# Patient Record
Sex: Female | Born: 1970
Health system: Southern US, Community
[De-identification: ages and names within clinical notes are randomized; demographics above are authoritative.]

## PROBLEM LIST (undated history)

## (undated) DIAGNOSIS — Z973 Presence of spectacles and contact lenses: Secondary | ICD-10-CM

## (undated) DIAGNOSIS — Z87828 Personal history of other (healed) physical injury and trauma: Secondary | ICD-10-CM

## (undated) HISTORY — PX: LUNG SURGERY: SHX703

## (undated) HISTORY — PX: TONSILLECTOMY: SUR1361

## (undated) HISTORY — PX: ENDOMETRIAL ABLATION: SHX621

## (undated) HISTORY — PX: CHOLECYSTECTOMY: SHX55

## (undated) HISTORY — PX: BACK SURGERY: SHX140

## (undated) HISTORY — PX: KNEE ARTHROSCOPY: SHX127

## (undated) HISTORY — PX: EYE SURGERY: SHX253

## (undated) HISTORY — PX: WISDOM TOOTH EXTRACTION: SHX21

## (undated) HISTORY — PX: KNEE ARTHROSCOPY: SUR90

---

## 2002-03-24 ENCOUNTER — Other Ambulatory Visit: Admission: RE | Admit: 2002-03-24 | Discharge: 2002-03-24 | Payer: Self-pay | Admitting: Family Medicine

## 2002-07-28 DIAGNOSIS — I2699 Other pulmonary embolism without acute cor pulmonale: Secondary | ICD-10-CM

## 2002-07-28 DIAGNOSIS — I82409 Acute embolism and thrombosis of unspecified deep veins of unspecified lower extremity: Secondary | ICD-10-CM

## 2002-07-28 HISTORY — DX: Acute embolism and thrombosis of unspecified deep veins of unspecified lower extremity: I82.409

## 2002-07-28 HISTORY — DX: Other pulmonary embolism without acute cor pulmonale: I26.99

## 2002-07-29 ENCOUNTER — Other Ambulatory Visit: Admission: RE | Admit: 2002-07-29 | Discharge: 2002-07-29 | Payer: Self-pay | Admitting: Family Medicine

## 2002-11-28 ENCOUNTER — Other Ambulatory Visit: Admission: RE | Admit: 2002-11-28 | Discharge: 2002-11-28 | Payer: Self-pay | Admitting: Family Medicine

## 2003-05-09 ENCOUNTER — Ambulatory Visit (HOSPITAL_BASED_OUTPATIENT_CLINIC_OR_DEPARTMENT_OTHER): Admission: RE | Admit: 2003-05-09 | Discharge: 2003-05-09 | Payer: Self-pay | Admitting: Orthopaedic Surgery

## 2003-05-15 ENCOUNTER — Encounter: Payer: Self-pay | Admitting: Emergency Medicine

## 2003-05-15 ENCOUNTER — Inpatient Hospital Stay (HOSPITAL_COMMUNITY): Admission: EM | Admit: 2003-05-15 | Discharge: 2003-05-22 | Payer: Self-pay | Admitting: Emergency Medicine

## 2003-11-03 ENCOUNTER — Encounter: Admission: RE | Admit: 2003-11-03 | Discharge: 2003-11-03 | Payer: Self-pay | Admitting: Oncology

## 2004-04-02 ENCOUNTER — Ambulatory Visit (HOSPITAL_BASED_OUTPATIENT_CLINIC_OR_DEPARTMENT_OTHER): Admission: RE | Admit: 2004-04-02 | Discharge: 2004-04-02 | Payer: Self-pay | Admitting: Orthopaedic Surgery

## 2004-04-02 ENCOUNTER — Ambulatory Visit (HOSPITAL_COMMUNITY): Admission: RE | Admit: 2004-04-02 | Discharge: 2004-04-02 | Payer: Self-pay | Admitting: Orthopaedic Surgery

## 2004-06-05 ENCOUNTER — Ambulatory Visit: Payer: Self-pay | Admitting: Oncology

## 2004-06-05 ENCOUNTER — Ambulatory Visit: Admission: RE | Admit: 2004-06-05 | Discharge: 2004-06-05 | Payer: Self-pay | Admitting: Oncology

## 2004-06-27 ENCOUNTER — Other Ambulatory Visit: Admission: RE | Admit: 2004-06-27 | Discharge: 2004-06-27 | Payer: Self-pay | Admitting: Family Medicine

## 2005-05-17 ENCOUNTER — Emergency Department (HOSPITAL_COMMUNITY): Admission: EM | Admit: 2005-05-17 | Discharge: 2005-05-17 | Payer: Self-pay | Admitting: Emergency Medicine

## 2005-09-02 ENCOUNTER — Other Ambulatory Visit: Admission: RE | Admit: 2005-09-02 | Discharge: 2005-09-02 | Payer: Self-pay | Admitting: Family Medicine

## 2006-02-05 ENCOUNTER — Other Ambulatory Visit: Admission: RE | Admit: 2006-02-05 | Discharge: 2006-02-05 | Payer: Self-pay | Admitting: Gynecology

## 2006-02-12 ENCOUNTER — Ambulatory Visit (HOSPITAL_COMMUNITY): Admission: RE | Admit: 2006-02-12 | Discharge: 2006-02-12 | Payer: Self-pay | Admitting: Gynecology

## 2006-02-13 ENCOUNTER — Emergency Department (HOSPITAL_COMMUNITY): Admission: EM | Admit: 2006-02-13 | Discharge: 2006-02-13 | Payer: Self-pay | Admitting: Emergency Medicine

## 2006-08-16 ENCOUNTER — Inpatient Hospital Stay (HOSPITAL_COMMUNITY): Admission: AD | Admit: 2006-08-16 | Discharge: 2006-08-19 | Payer: Self-pay | Admitting: Gynecology

## 2006-08-17 ENCOUNTER — Encounter (INDEPENDENT_AMBULATORY_CARE_PROVIDER_SITE_OTHER): Payer: Self-pay | Admitting: Specialist

## 2006-09-15 ENCOUNTER — Other Ambulatory Visit: Admission: RE | Admit: 2006-09-15 | Discharge: 2006-09-15 | Payer: Self-pay | Admitting: Gynecology

## 2007-02-13 ENCOUNTER — Ambulatory Visit: Payer: Self-pay | Admitting: Critical Care Medicine

## 2007-02-13 ENCOUNTER — Inpatient Hospital Stay (HOSPITAL_COMMUNITY): Admission: EM | Admit: 2007-02-13 | Discharge: 2007-03-09 | Payer: Self-pay | Admitting: Emergency Medicine

## 2007-02-18 ENCOUNTER — Ambulatory Visit: Payer: Self-pay | Admitting: Infectious Disease

## 2007-02-19 ENCOUNTER — Ambulatory Visit: Payer: Self-pay | Admitting: Physical Medicine & Rehabilitation

## 2007-02-19 ENCOUNTER — Ambulatory Visit: Payer: Self-pay | Admitting: Internal Medicine

## 2007-02-24 ENCOUNTER — Encounter: Payer: Self-pay | Admitting: Thoracic Surgery

## 2007-02-24 ENCOUNTER — Ambulatory Visit: Payer: Self-pay | Admitting: Thoracic Surgery

## 2007-03-01 ENCOUNTER — Encounter: Payer: Self-pay | Admitting: Thoracic Surgery

## 2007-03-09 ENCOUNTER — Ambulatory Visit: Payer: Self-pay | Admitting: Physical Medicine & Rehabilitation

## 2007-03-09 ENCOUNTER — Inpatient Hospital Stay (HOSPITAL_COMMUNITY)
Admission: RE | Admit: 2007-03-09 | Discharge: 2007-04-12 | Payer: Self-pay | Admitting: Physical Medicine & Rehabilitation

## 2007-04-15 ENCOUNTER — Encounter: Payer: Self-pay | Admitting: Infectious Disease

## 2007-04-20 ENCOUNTER — Encounter: Payer: Self-pay | Admitting: Infectious Disease

## 2007-04-20 DIAGNOSIS — G061 Intraspinal abscess and granuloma: Secondary | ICD-10-CM | POA: Insufficient documentation

## 2007-05-12 ENCOUNTER — Encounter: Payer: Self-pay | Admitting: Infectious Disease

## 2007-05-21 ENCOUNTER — Encounter
Admission: RE | Admit: 2007-05-21 | Discharge: 2007-08-19 | Payer: Self-pay | Admitting: Physical Medicine & Rehabilitation

## 2007-05-21 ENCOUNTER — Ambulatory Visit: Payer: Self-pay | Admitting: Physical Medicine & Rehabilitation

## 2007-05-21 ENCOUNTER — Encounter: Payer: Self-pay | Admitting: Infectious Disease

## 2007-05-25 ENCOUNTER — Ambulatory Visit (HOSPITAL_COMMUNITY): Admission: RE | Admit: 2007-05-25 | Discharge: 2007-05-25 | Payer: Self-pay | Admitting: Infectious Disease

## 2007-06-04 ENCOUNTER — Ambulatory Visit: Payer: Self-pay | Admitting: Infectious Disease

## 2007-06-04 DIAGNOSIS — A4901 Methicillin susceptible Staphylococcus aureus infection, unspecified site: Secondary | ICD-10-CM | POA: Insufficient documentation

## 2007-06-04 DIAGNOSIS — I82409 Acute embolism and thrombosis of unspecified deep veins of unspecified lower extremity: Secondary | ICD-10-CM | POA: Insufficient documentation

## 2007-06-04 DIAGNOSIS — H6690 Otitis media, unspecified, unspecified ear: Secondary | ICD-10-CM | POA: Insufficient documentation

## 2007-06-04 DIAGNOSIS — N390 Urinary tract infection, site not specified: Secondary | ICD-10-CM | POA: Insufficient documentation

## 2007-06-04 DIAGNOSIS — D6859 Other primary thrombophilia: Secondary | ICD-10-CM | POA: Insufficient documentation

## 2007-06-04 LAB — CONVERTED CEMR LAB
ALT: 18 units/L (ref 0–35)
AST: 18 units/L (ref 0–37)
Alkaline Phosphatase: 81 units/L (ref 39–117)
Basophils Absolute: 0 10*3/uL (ref 0.0–0.1)
CO2: 26 meq/L (ref 19–32)
CRP: 1.3 mg/dL — ABNORMAL HIGH (ref <0.6)
Creatinine, Ser: 0.64 mg/dL (ref 0.40–1.20)
Eosinophils Absolute: 0.2 10*3/uL (ref 0.0–0.7)
Eosinophils Relative: 2 % (ref 0–5)
HCT: 44.9 % (ref 36.0–46.0)
Lymphs Abs: 3.6 10*3/uL — ABNORMAL HIGH (ref 0.7–3.3)
MCV: 76.8 fL — ABNORMAL LOW (ref 78.0–100.0)
Neutrophils Relative %: 51 % (ref 43–77)
Platelets: 300 10*3/uL (ref 150–400)
RDW: 17.8 % — ABNORMAL HIGH (ref 11.5–14.0)
Sed Rate: 7 mm/hr (ref 0–22)
Sodium: 138 meq/L (ref 135–145)
Total Bilirubin: 0.3 mg/dL (ref 0.3–1.2)
Total Protein: 7.1 g/dL (ref 6.0–8.3)
WBC: 9 10*3/uL (ref 4.0–10.5)

## 2007-06-08 ENCOUNTER — Encounter: Payer: Self-pay | Admitting: Infectious Disease

## 2007-07-20 ENCOUNTER — Ambulatory Visit: Payer: Self-pay | Admitting: Physical Medicine & Rehabilitation

## 2007-08-02 ENCOUNTER — Telehealth: Payer: Self-pay | Admitting: Infectious Disease

## 2007-08-05 ENCOUNTER — Ambulatory Visit (HOSPITAL_COMMUNITY): Admission: RE | Admit: 2007-08-05 | Discharge: 2007-08-05 | Payer: Self-pay | Admitting: Infectious Disease

## 2007-08-09 ENCOUNTER — Ambulatory Visit: Payer: Self-pay | Admitting: Infectious Disease

## 2007-08-09 DIAGNOSIS — J13 Pneumonia due to Streptococcus pneumoniae: Secondary | ICD-10-CM | POA: Insufficient documentation

## 2007-08-09 DIAGNOSIS — J869 Pyothorax without fistula: Secondary | ICD-10-CM | POA: Insufficient documentation

## 2007-08-09 LAB — CONVERTED CEMR LAB
Albumin: 3.3 g/dL — ABNORMAL LOW (ref 3.5–5.2)
BUN: 10 mg/dL (ref 6–23)
Basophils Absolute: 0 10*3/uL (ref 0.0–0.1)
CO2: 28 meq/L (ref 19–32)
CRP: 4 mg/dL — ABNORMAL HIGH (ref <0.6)
Eosinophils Absolute: 0.3 10*3/uL (ref 0.0–0.7)
Eosinophils Relative: 3 % (ref 0–5)
Glucose, Bld: 89 mg/dL (ref 70–99)
HCT: 40.8 % (ref 36.0–46.0)
Hemoglobin: 13.7 g/dL (ref 12.0–15.0)
Lymphocytes Relative: 46 % (ref 12–46)
MCHC: 33.6 g/dL (ref 30.0–36.0)
MCV: 78.8 fL (ref 78.0–100.0)
Monocytes Absolute: 0.7 10*3/uL (ref 0.1–1.0)
Platelets: 333 10*3/uL (ref 150–400)
RDW: 19.1 % — ABNORMAL HIGH (ref 11.5–15.5)
Sed Rate: 25 mm/hr — ABNORMAL HIGH (ref 0–22)
Sodium: 139 meq/L (ref 135–145)
Total Bilirubin: 0.2 mg/dL — ABNORMAL LOW (ref 0.3–1.2)
Total Protein: 6.2 g/dL (ref 6.0–8.3)

## 2007-08-12 ENCOUNTER — Telehealth: Payer: Self-pay | Admitting: Infectious Disease

## 2007-08-17 ENCOUNTER — Encounter: Admission: RE | Admit: 2007-08-17 | Discharge: 2007-08-24 | Payer: Self-pay | Admitting: Specialist

## 2007-08-25 ENCOUNTER — Encounter: Admission: RE | Admit: 2007-08-25 | Discharge: 2007-11-23 | Payer: Self-pay | Admitting: Specialist

## 2007-10-15 ENCOUNTER — Encounter (INDEPENDENT_AMBULATORY_CARE_PROVIDER_SITE_OTHER): Payer: Self-pay | Admitting: Family Medicine

## 2007-10-15 ENCOUNTER — Ambulatory Visit (HOSPITAL_COMMUNITY): Admission: RE | Admit: 2007-10-15 | Discharge: 2007-10-15 | Payer: Self-pay | Admitting: Family Medicine

## 2007-10-15 ENCOUNTER — Ambulatory Visit: Payer: Self-pay | Admitting: *Deleted

## 2007-10-16 ENCOUNTER — Encounter
Admission: RE | Admit: 2007-10-16 | Discharge: 2007-12-24 | Payer: Self-pay | Admitting: Physical Medicine & Rehabilitation

## 2007-11-03 ENCOUNTER — Encounter
Admission: RE | Admit: 2007-11-03 | Discharge: 2008-02-01 | Payer: Self-pay | Admitting: Physical Medicine & Rehabilitation

## 2007-12-01 ENCOUNTER — Ambulatory Visit: Payer: Self-pay | Admitting: Physical Medicine & Rehabilitation

## 2008-01-06 ENCOUNTER — Encounter: Payer: Self-pay | Admitting: Infectious Disease

## 2008-01-11 ENCOUNTER — Ambulatory Visit: Payer: Self-pay | Admitting: Infectious Disease

## 2008-01-11 DIAGNOSIS — R61 Generalized hyperhidrosis: Secondary | ICD-10-CM | POA: Insufficient documentation

## 2008-01-11 DIAGNOSIS — Z87448 Personal history of other diseases of urinary system: Secondary | ICD-10-CM | POA: Insufficient documentation

## 2008-01-11 LAB — CONVERTED CEMR LAB
Bilirubin Urine: NEGATIVE
CO2: 24 meq/L (ref 19–32)
Chloride: 104 meq/L (ref 96–112)
HCT: 42.6 % (ref 36.0–46.0)
Lymphocytes Relative: 46 % (ref 12–46)
Lymphs Abs: 4.7 10*3/uL — ABNORMAL HIGH (ref 0.7–4.0)
Monocytes Relative: 7 % (ref 3–12)
Neutrophils Relative %: 42 % — ABNORMAL LOW (ref 43–77)
Platelets: 263 10*3/uL (ref 150–400)
RBC: 5.37 M/uL — ABNORMAL HIGH (ref 3.87–5.11)
Sed Rate: 5 mm/hr (ref 0–22)
Sodium: 139 meq/L (ref 135–145)
Specific Gravity, Urine: 1.023 (ref 1.005–1.03)
Urine Glucose: NEGATIVE mg/dL
WBC: 10.2 10*3/uL (ref 4.0–10.5)
pH: 6 (ref 5.0–8.0)

## 2008-01-12 ENCOUNTER — Ambulatory Visit (HOSPITAL_COMMUNITY): Admission: RE | Admit: 2008-01-12 | Discharge: 2008-01-12 | Payer: Self-pay | Admitting: Infectious Disease

## 2008-01-12 ENCOUNTER — Encounter: Payer: Self-pay | Admitting: Infectious Disease

## 2008-01-24 ENCOUNTER — Telehealth: Payer: Self-pay | Admitting: Infectious Disease

## 2008-03-13 ENCOUNTER — Telehealth: Payer: Self-pay | Admitting: Infectious Disease

## 2008-03-20 ENCOUNTER — Ambulatory Visit: Payer: Self-pay | Admitting: Infectious Disease

## 2008-03-20 DIAGNOSIS — IMO0002 Reserved for concepts with insufficient information to code with codable children: Secondary | ICD-10-CM | POA: Insufficient documentation

## 2008-03-20 LAB — CONVERTED CEMR LAB
Eosinophils Absolute: 0.2 10*3/uL (ref 0.0–0.7)
Glucose, Bld: 89 mg/dL (ref 70–99)
Lymphs Abs: 3.5 10*3/uL (ref 0.7–4.0)
MCV: 81.7 fL (ref 78.0–100.0)
Neutrophils Relative %: 47 % (ref 43–77)
Platelets: 272 10*3/uL (ref 150–400)
Potassium: 4.4 meq/L (ref 3.5–5.3)
Sodium: 141 meq/L (ref 135–145)
WBC: 8.3 10*3/uL (ref 4.0–10.5)

## 2008-03-22 ENCOUNTER — Ambulatory Visit: Payer: Self-pay | Admitting: Infectious Disease

## 2008-03-23 LAB — CONVERTED CEMR LAB
Hemoglobin, Urine: NEGATIVE
Urine Glucose: NEGATIVE mg/dL
pH: 6 (ref 5.0–8.0)

## 2008-03-27 ENCOUNTER — Telehealth: Payer: Self-pay | Admitting: Infectious Disease

## 2008-03-29 ENCOUNTER — Encounter: Payer: Self-pay | Admitting: Infectious Disease

## 2008-04-30 ENCOUNTER — Encounter: Admission: RE | Admit: 2008-04-30 | Discharge: 2008-04-30 | Payer: Self-pay | Admitting: Orthopaedic Surgery

## 2008-05-04 ENCOUNTER — Encounter
Admission: RE | Admit: 2008-05-04 | Discharge: 2008-08-02 | Payer: Self-pay | Admitting: Physical Medicine & Rehabilitation

## 2008-05-05 ENCOUNTER — Ambulatory Visit: Payer: Self-pay | Admitting: Physical Medicine & Rehabilitation

## 2008-06-08 ENCOUNTER — Ambulatory Visit: Payer: Self-pay | Admitting: Physical Medicine & Rehabilitation

## 2008-06-22 IMAGING — CT CT ABDOMEN W/ CM
2 of 5 series · 16 of 46 positions shown, 18 images · IV contrast (APPLIED)
Comparison: 02/21/07.

CLINICAL DATA: Increasing left anterior chest pain and left anterior abdominal pain.  
 CHEST CT WITH CONTRAST:
TECHNIQUE: Multidetector CT imaging of the chest was performed following the standard protocol during bolus administration of intravenous contrast.
 Contrast:  110 cc Omnipaque 300.
TECHNIQUE: Multidetector CT imaging of the abdomen was performed following the standard protocol during bolus administration of intravenous contrast.

[Series 3: chest 5.0 b31f · axial · 0.92mm/px · z∈[-683,-114]mm · 13 of 303 slices shown, 15 images]
[im 16/303  soft-tissue]
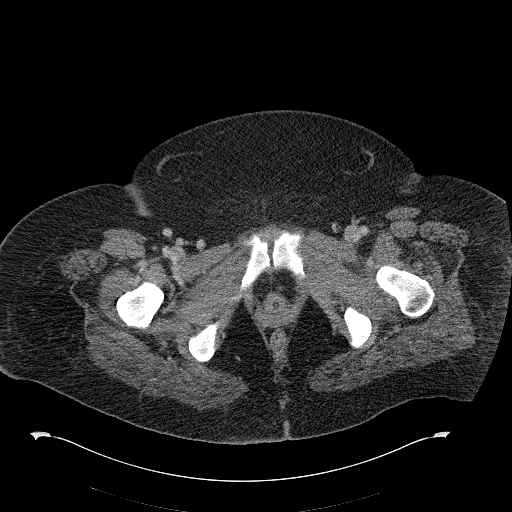
[im 16/303  bone]
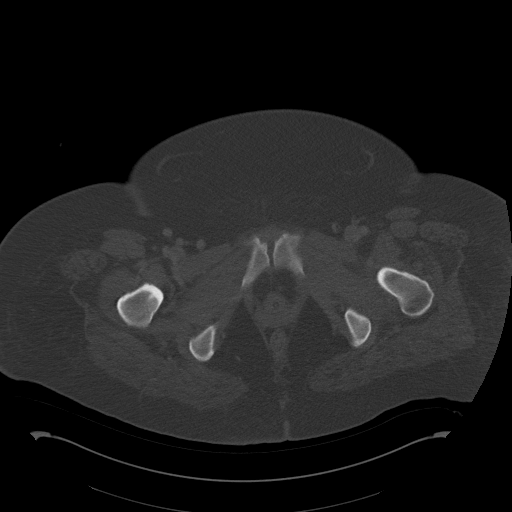
[im 46/303  soft-tissue]
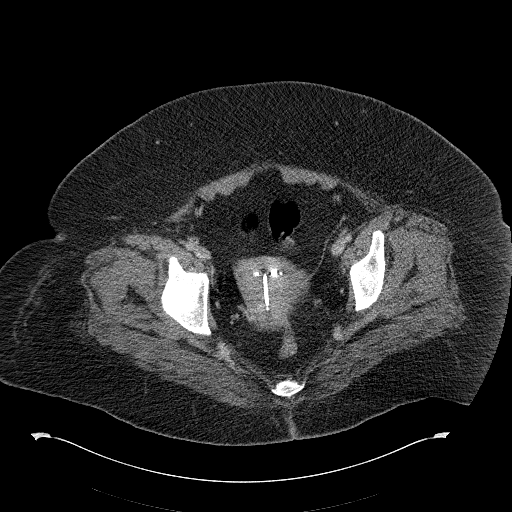
[im 61/303  soft-tissue]
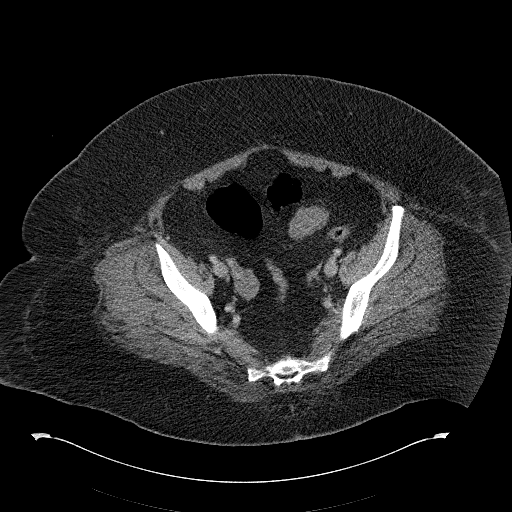
[im 91/303  soft-tissue]
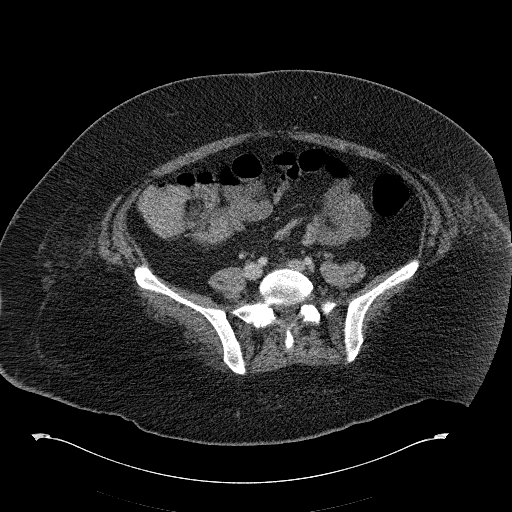
[im 106/303  soft-tissue]
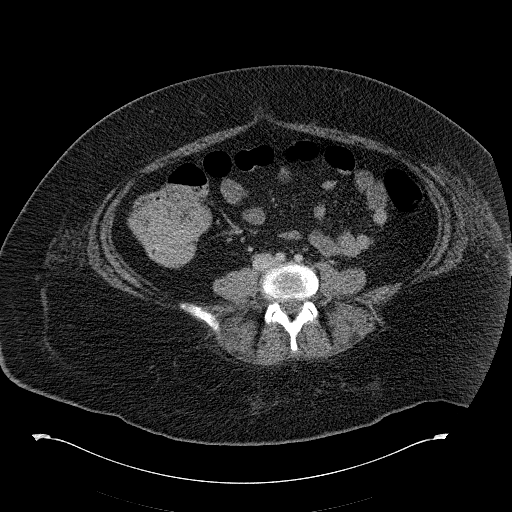
[im 136/303  soft-tissue]
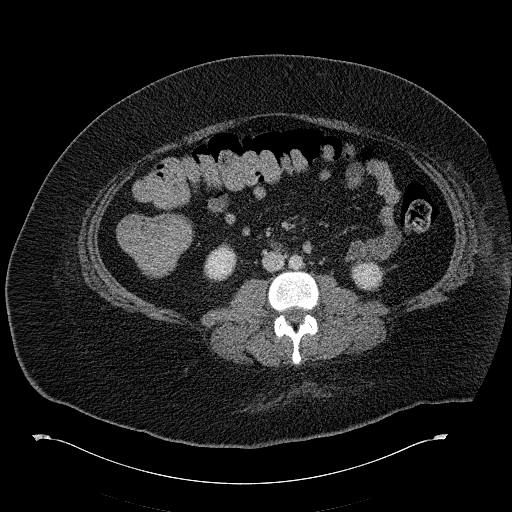
[im 152/303  soft-tissue]
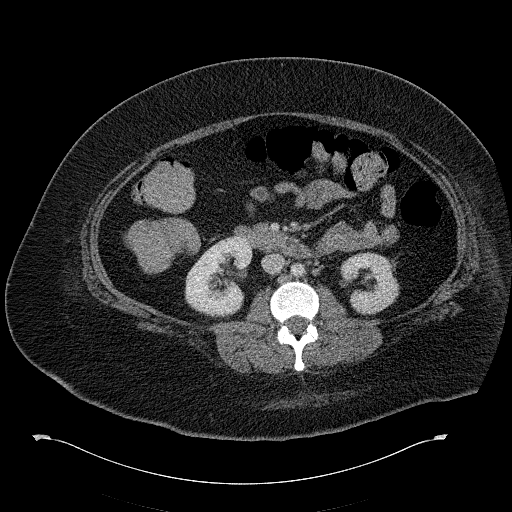
[im 167/303  soft-tissue]
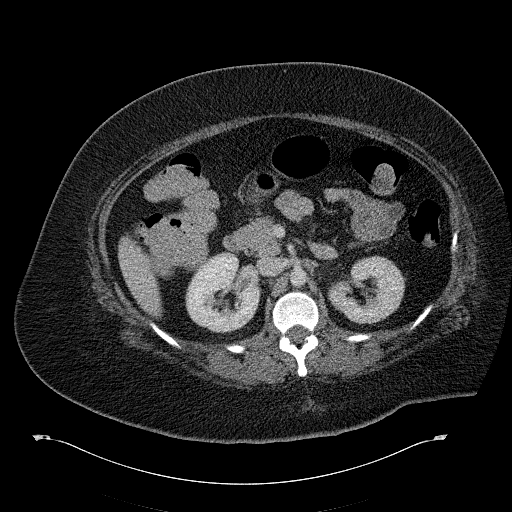
[im 197/303  soft-tissue]
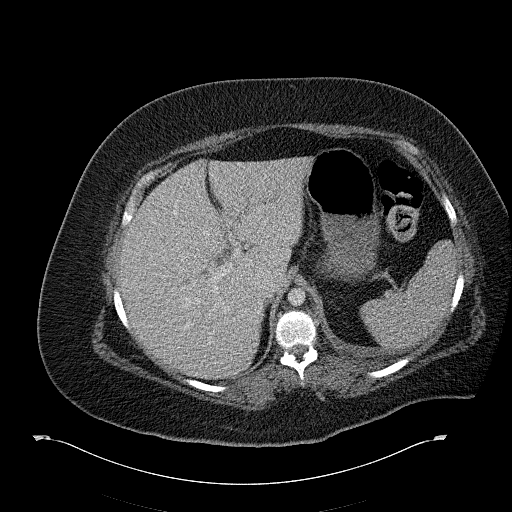
[im 197/303  bone]
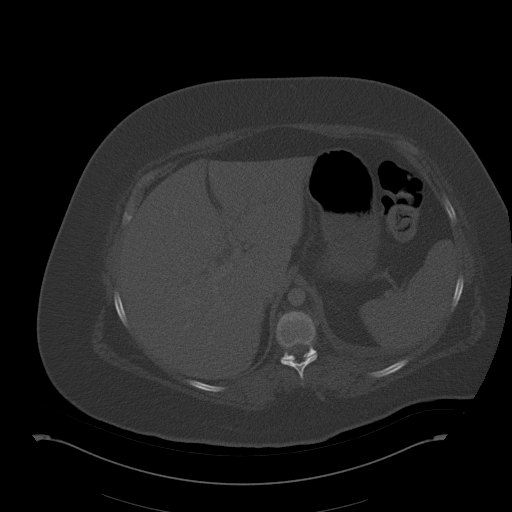
[im 212/303  soft-tissue]
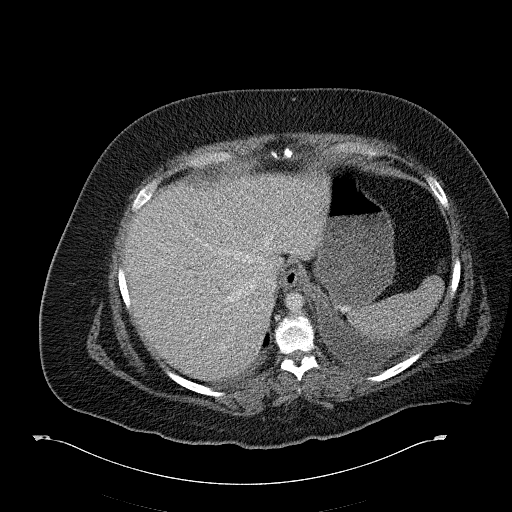
[im 242/303  soft-tissue]
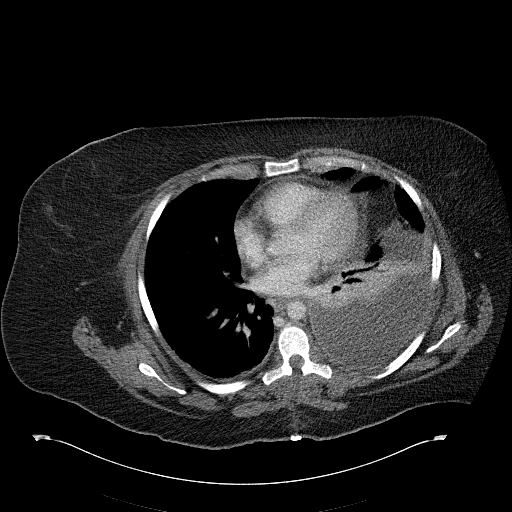
[im 257/303  soft-tissue]
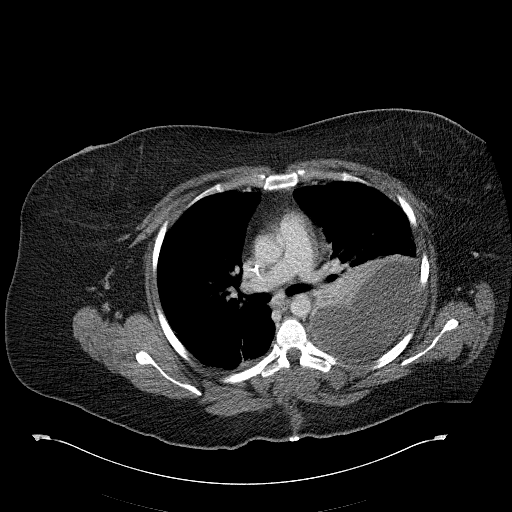
[im 287/303  soft-tissue]
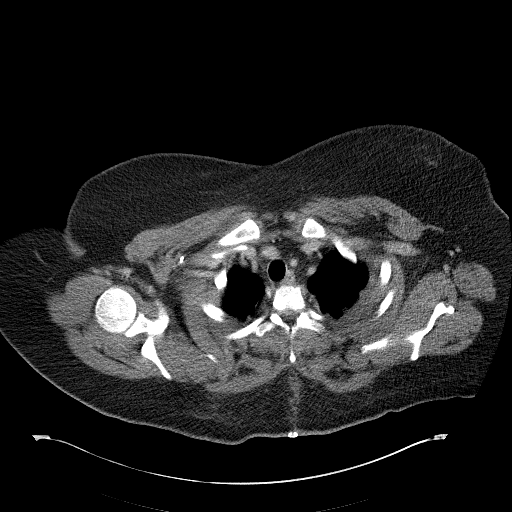

[Series 602: coronals · coronal · 1.25mm/px · 3 of 186 slices shown]
[im 62/186  soft-tissue]
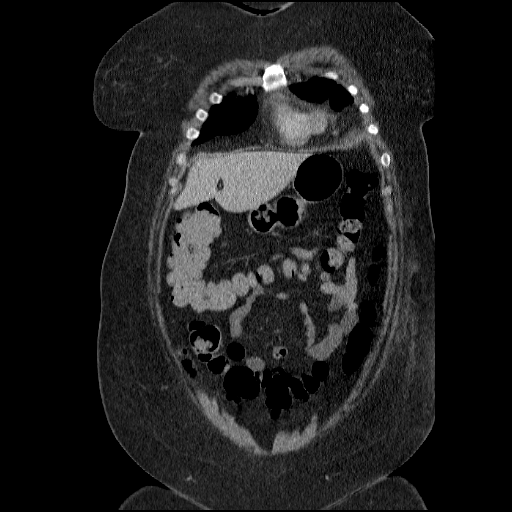
[im 83/186  soft-tissue]
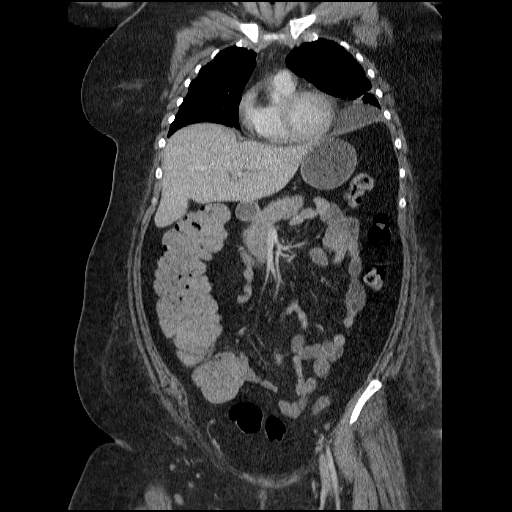
[im 103/186  soft-tissue]
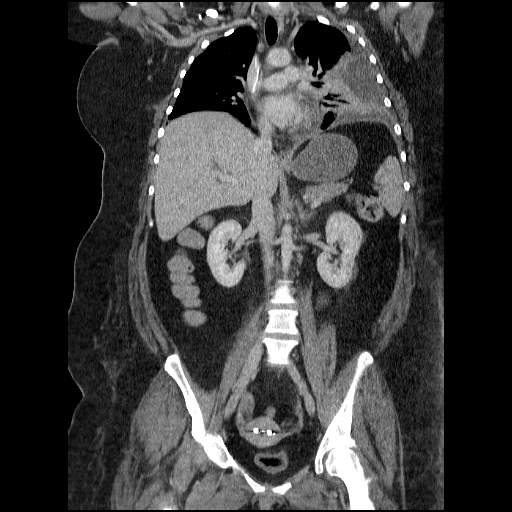

[16 of 46 positions shown; findings below may reference images not displayed]

FINDINGS: The patient has an appreciable increase in the large left pleural effusion and now has complete atelectasis of the left lower lobe with some air bronchograms.  There is some compression of the left upper lobe with some atelectasis in the posterior aspect of the left upper lobe.  No significant hilar or mediastinal adenopathy or discreet mass.  There is a tiny right effusion, which is essentially unchanged.  The mild interstitial edema present on the prior exam has resolved.  There is no discrete bony abnormality.  There is evidence of previous bilateral laminectomies in the lower thoracic spine.  The detail of the thecal sac and paraspinal soft tissues is limited on CT exam.  
 Distal area appears unchanged since the prior exam.  Overall heart size has diminished and is within normal limits.
IMPRESSION: 1. Increased left pleural effusion and the patient now has complete atelectasis of the left lower lobe secondary to compression by the effusion.  The effusion is of unknown etiology.
 2. Interval decrease in heart size and resolution of mild edema. 
 ABDOMEN CT WITH CONTRAST:
FINDINGS: The gallbladder has been removed.  The liver, spleen, pancreas, adrenal glands and kidneys appear normal.  No dilated loops of large or small bowel.  There is some mild edema in the subcutaneous fat of the abdomen, slightly greater on the left than on the right, but there is no discrete abnormality of the muscles or bony structures.
IMPRESSION: Benign appearing abdomen.

## 2008-07-01 IMAGING — CR DG CHEST 1V PORT
1 series · 1 of 1 positions shown · non-contrast
Comparison: One day prior

CLINICAL DATA: Back pain. Obesity. VATS.

CHEST - 1 VIEW

[view not recorded]
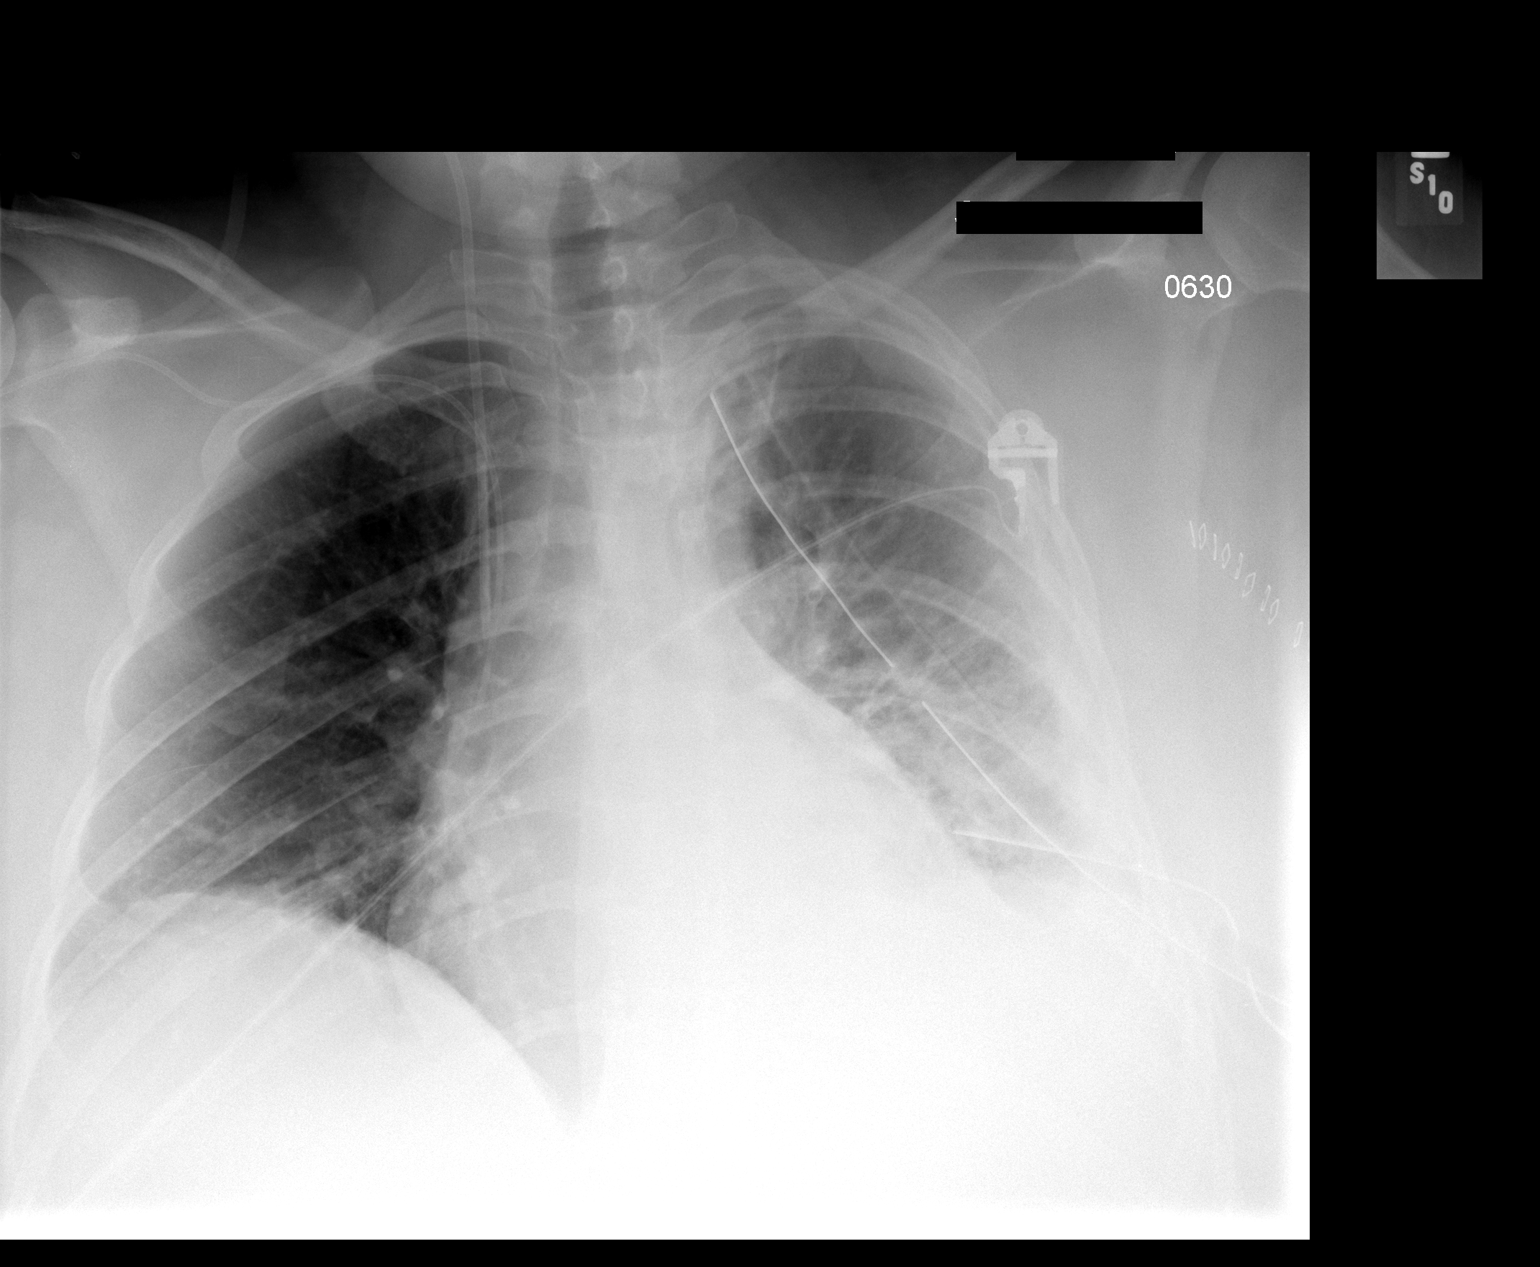

[1 of 1 positions shown; findings below may reference images not displayed]

FINDINGS: Right-sided PICC line and IJ line are unchanged in position. Only 2
left-sided chest tubes identified today. One has its side port outside the chest
wall. No pneumothorax.

Midline trachea. Mild cardiomegaly. Minimal right base atelectasis. Slightly
improved left lung aeration with patchy lower lobe airspace opacity remaining.

IMPRESSION

1. Two left-sided chest tubes identified without evidence of pneumothorax.
2. Slightly improved aeration today with decreased left base airspace disease.

## 2008-07-02 IMAGING — CR DG CHEST 1V PORT
1 series · 1 of 1 positions shown · non-contrast
Comparison: 03/05/07.

CLINICAL DATA: Postop from VATS surgery.  Followup pleural effusion.  
PORTABLE CHEST ? 1 VIEW:

[view not recorded]
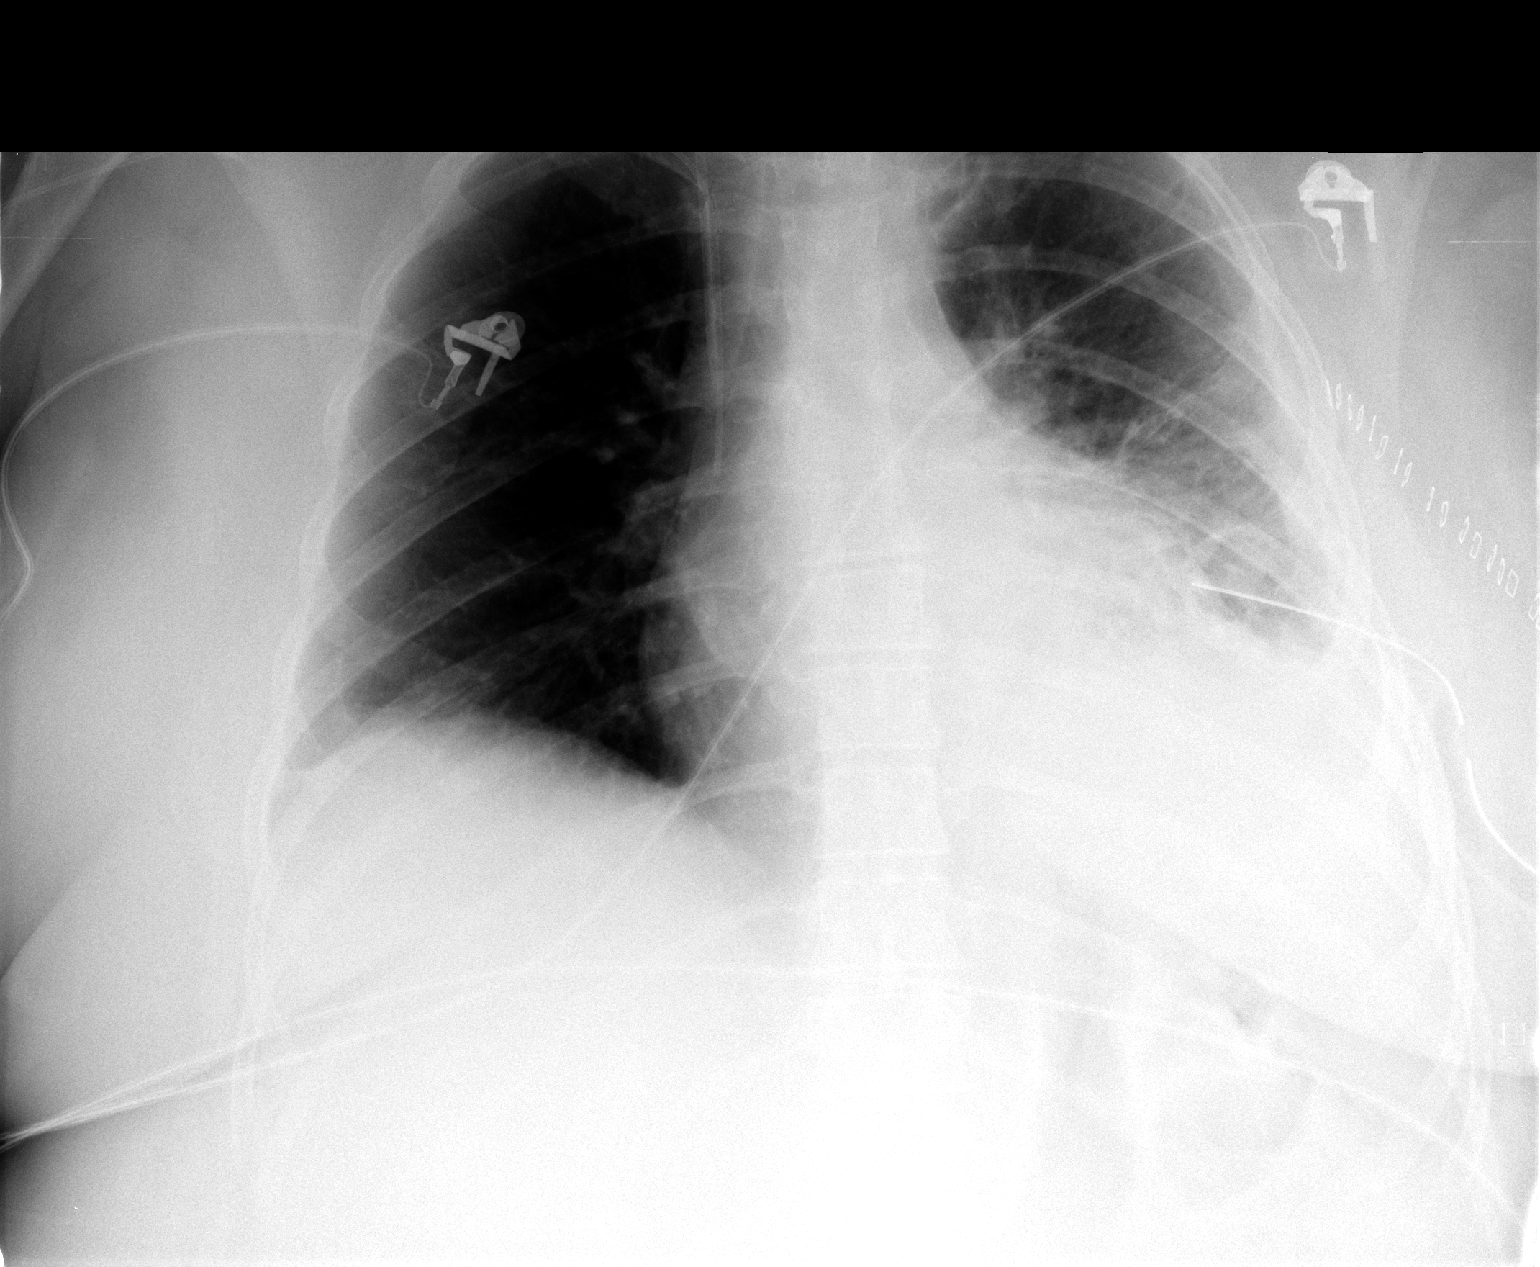

[1 of 1 positions shown; findings below may reference images not displayed]

FINDINGS: One left chest tube has been removed and one remains in place.  No pneumothorax is identified.  
There is persistent infiltrate and volume loss at the left lung base.  Mild atelectasis at the right lung base is unchanged.  No other significant changes are seen since the prior study.
IMPRESSION: 1. No evidence of pneumothorax following removal of one of the left chest tubes. 
2. Stable left lower lung infiltrate or atelectasis and volume loss. 
3. Stable mild right basilar atelectasis.

## 2008-09-27 ENCOUNTER — Encounter: Payer: Self-pay | Admitting: Infectious Disease

## 2008-10-09 ENCOUNTER — Encounter
Admission: RE | Admit: 2008-10-09 | Discharge: 2008-10-09 | Payer: Self-pay | Admitting: Physical Medicine & Rehabilitation

## 2010-12-10 NOTE — Discharge Summary (Signed)
NAMEMarland Kitchen  [name], [name]          ACCOUNT NO.:  [id number]   MEDICAL RECORD NO.:  [id number]          PATIENT TYPE:  IPS   LOCATION:  4004                         FACILITY:  [facility]   PHYSICIAN:  Ellwood Dense, M.D.   DATE OF BIRTH:  [birth year]   DATE OF ADMISSION:  03/09/2007  DATE OF DISCHARGE:  04/12/2007                               DISCHARGE SUMMARY   DISCHARGE DIAGNOSES:  1. Thoracic T5-T8 epidural abscess status post evacuation of abscess      February 16, 2007.  2. Methicillin-sensitive Staphylococcus aureus - intravenous Zinacef      until April 19, 2007, and stop.  3. History of deep vein thrombosis with Coumadin therapy.  4. Left pleural empyema status post video assisted thoracoscopic      surgery March 01, 2007.  5. Positive lupus anticoagulant.  6. Anxiety with depression.  7. Neurogenic bowel and bladder.  8. Anemia.   This is a 40 year old white female with history of lupus anticoagulant,  deep vein thrombosis and low back pain since mid July with progressive  weakness and numbness, left greater than right, and urinary frequency.  Admitted February 13, 2007, for a workup.  MRI of the thoracic and lumbar  spine, showed a large epidural abscess with cord compression thoracic T5-  T8.  Underwent thoracic laminectomy for evacuation of epidural abscess  thoracic spine February 16, 2007.  Wound cultures positive for staph aureus  and placed on intravenous vancomycin per Dr. Phoebe Perch.  Vancomycin  discontinued, switched to intravenous Zinacef.  Hospital course with  left chest discomfort, found to have a large pleural empyema.  Underwent  left VATS thoracotomy drainage of empyema March 01, 2007, per Dr.  Edwyna Shell.  Due to history of deep vein thrombosis, placed on Coumadin  therapy.  Slow progress with mobility and paraparesis.  Bouts of  constipation as well as neurogenic bladder with inability to void.  It  was at the discretion of Dr. Phoebe Perch that intravenous intussusception  continued through April 19, 2007, and then check MRI, decide plan of  antibiotic care.  Also check CBC, sedimentation rate and a CRP at that  time.   PAST MEDICAL HISTORY:  See discharge diagnoses.  Remote smoker.  No  alcohol.   ALLERGIES:  CODEINE.   SOCIAL HISTORY:  Lives with mother, two children ages 13 year old and 42-  month-old.  She works for [employer].  Two-  level home.   MEDICATIONS PRIOR TO ADMISSION:  1. Effexor.  2. Wellbutrin.  3. Percocet.  4. Flexeril.  5. Naprosyn.   REHABILITATION HOSPITAL COURSE:  The patient was admitted to inpatient  rehab services with therapies initiated on a three-hour daily basis  consisting of physical therapy, occupational therapy and rehabilitation  nursing.  The following issues were addressed during the patient's  rehabilitation stay.  Pertaining to [name]'s thoracic T5-T8  epidural abscess, she had undergone evacuation of abscess February 16, 2007.  She remained on intravenous Zinacef per Dr. Phoebe Perch until April 19, 2007, with planned follow-up MRI, sedimentation rate, CBC and CRP at  that time.  She remained afebrile throughout her course.  Maintained on  Coumadin therapy for history of deep vein thrombosis prophylaxis.  Latest INR of 2.5, to be followed by Dr. Sigmund Hazel with a home health  nurse to be arranged.  During her hospital course, she had undergone a  VATS for a left pleural empyema per Dr. Edwyna Shell.  Surgical site healing  nicely.  Neurogenic bowel and bladder.  Foley catheter tube had been  placed after failing voiding trial.  Bowel program established with full  family teaching completed.  Pain control with the use of Neurontin and  Zanaflex as well as oxycodone for breakthrough pain.   Overall for her functional status, she was supervision for bed mobility,  minimal assist transfers, close supervision to propel her wheelchair.  Simple set up for upper body activities of daily  living.  She was  progressing slowly during her rehab course.  Insurance pending.  Stressed the need for discharge.   Latest labs showed an INR of 2.5, hemoglobin 11.8, hematocrit 36.5,  platelet 307,000.  Sodium 137, potassium 3.5, BUN 7, creatinine 0.5.   DISCHARGE MEDICATIONS:  At the time of dictation included  1. Coumadin with latest dose of 5 mg daily.  2. Multivitamin daily.  3. Protonix 40 mg daily.  4. Wellbutrin XL 150 mg daily.  5. Effexor XR 225 mg daily.  6. Fibercon 1250 mg p.o. daily.  7. Neurontin 400 mg twice daily.  8. MiraLax 17 grams p.o. daily.  9. Desyrel 25 mg p.o. nightly as needed.  10.Zanaflex 4 mg p.o. every 8 hours.  11.Xanax 0.5 mg p.o. every 6 hours as needed for anxiety.  12.IV Zinacef 1.5 grams until April 19, 2007, and stop.  13.Oxycodone immediate release 5 mg one or two tablets every 4 hours      as needed for pain, dispense of 60 tablets.   DIET:  Regular.   SPECIAL INSTRUCTIONS:  Home health nurse for Coumadin therapy with next  prothrombin time on Thursday, April 15, 2007, results to Dr. Sigmund Hazel, [phone].  Home health nurse for intravenous Zinacef 1.5 grams  every 12 hours on April 19, 2007, and stop.  Dr. Phoebe Perch will need to  be contacted regarding plan for MRI, CBC, sedimentation rate and CRP  prior to discontinuation of these antibiotics.  Foley catheter tube  remained in place with routine changing her home health nurse.      Mariam Dollar, P.A.    ______________________________  Ellwood Dense, M.D.    DA/MEDQ  D:  04/12/2007  T:  04/12/2007  Job:  [id number]   cc:   Clydene Fake, M.D.  Charlcie Cradle Delford Field, MD, [facility]  Fransisco Hertz, M.D.  Ines Bloomer, M.D.

## 2010-12-10 NOTE — Assessment & Plan Note (Signed)
[name] returns to clinic today for followup evaluation.  She has  been attending outpatient physical therapy at the [facility] address  2 times per week.  They are planning on a hiatus for a couple months and  she will continue her home exercise program.  Her therapist will then  reevaluate her.  She has been progressed to the point of using [place]strand  crutches to walk 1000 feet.  She does note increased tone especially in  her upper thighs when she is up walking.  She also reports tingling pain  of her feet and calves.  We have adjusted her medication in the office  today.  She is getting better relief from the oxycodone compared to the  hydrocodone which we had to go to during the recent shortage.  She is  mostly continent of bowel and bladder at the present time.  She  continues to use an enema at bedtime and uses oxybutynin 1/2 tablet a  day for bladder continence.  She has not tolerated baclofen in the past  when she was too sleepy.   MEDICATIONS:  1. Neurontin 40 mg three times a day.  2. Effexor XR 225 mg a day.  3. Zanaflex 4 mg three times a day.  4. Oxycodone immediate release 5 mg four times a day p.r.n.  5. Oxybutynin one-half tablet a day.   REVIEW OF SYSTEMS:  Positive for limb swelling, weight gain, and night  sweats.   PHYSICAL EXAMINATION:  Well-appearing slightly over weight adult female  seated in a manual wheelchair.  Blood pressure 128/69 with a pulse 76, respiratory rate 18 and O2  saturation 100% on room air.  She has 4+/5 strength throughout the bilateral upper extremities.  Hip  flexion were 4+/5 and ankle dorsiflexion was 2/5.  Hamstring strength  was 2-/5 bilaterally.   IMPRESSION:  1. Status post thoracic epidural abscess with subsequent evacuation      and completion of antibiotics.  2. History of left pleural empyema treated with VATS surgery March 01, 2007, with positive lupus anticoagulant.  3. Anxiety disorder with depression.  4.  Neurogenic bowel and bladder secondary to number 1 improved.  5. Neuropathic pain of the bilateral lower extremities distally.   At the present time, we have increased the patient's Zanaflex and  Neurontin each up to four times a day from three times a day.  She was  given new scripts for this medication in the office today.  We also  continued her oxycodone at 5 mg four times a day p.r.n.  We will plan on  seeing the patient in followup in this office in approximately 5 months'  time.  She is encouraged to continue her home exercise program and  followup with her therapist as previously planned.  We will plan on  seeing her in followup as noted above.           ______________________________  Ellwood Dense, M.D.     DC/MedQ  D:  12/01/2007 11:04:06  T:  12/01/2007 11:44:08  Job #:  [id number]

## 2010-12-10 NOTE — Assessment & Plan Note (Signed)
[name] returns today for follow up evaluation accompanied by her  mother. The patient is a 40 year old Caucasian female with a history of  Lupus anticoagulant, knee pain thrombosis along with low back pain since  mid July 2008. She developed progressive numbness and weakness, left  greater than right along with urinary frequency.  She was admitted February 13, 2007, for workup. MRI scan of the thoracic and lumbar spine showed  large epidural abscess with cord compression in the thoracic region T6-  T8. The patient underwent thoracic laminectomy for evacuation of the  epidural abscess of her thoracic spine February 16, 2007.  Wound cultures  were positive for staphylococcus aureus and she was treated with IV  Vancomycin per Dr. Phoebe Perch. Vancomycin was discontinued and switched to  intravenous Zinacef. She was also found to have a large pleural empyema  and underwent a left VATS thoracotomy drainage of the empyema on March 01, 2007, by Dr. Edwyna Shell. She was placed on Coumadin for DVT prophylaxis.   The patient eventually moved to the rehabilitation unit March 09, 2007,  remained there until discharge April 12, 2007.  The patient  continued to receive home health therapy three times per week. She was  also continued on IV Zinacef  q.12 hours and has a follow up MRI scan  due tomorrow with a subsequent follow up with her infectious disease  physician Dr. Synthia Innocent.  Dr. Phoebe Perch has released her at this point.   In turns of her bowels, she has only rare accidents and uses [employer]  suppository with good results. She is seeing Dr. Annabell Howells and just had  urodynamic studies today. They are placed back in place and I have given  her a prescription for bladder spasm medicine. They are reporting that  she does void minimal amounts, but they would prefer to keep the Foley  in at this point to decompress the bladder and keep it from over  extending.   The patient continues to use the oxycodone 5 mg  approximately three  times per day. She also needs a refill on the Neurontin in the office  today.  She does have some complaints of left  knee pain, which has been  a problem for her some in the past when she had left knee surgery. It  maybe advisable to get her a brace for her left knee to be used on  standing and ambulation.  She apparently can take a few steps with the  walker with minimal to moderate assist.   MEDICATIONS:  1. Neurontin 400 mg b.i.d.  2. Wellbutrin XL 150 mg.  3. Effexor XR 225 mg.  4. Zanaflex 4 mg q.8 hours.  5. Zinacef 1.5 grams b.i.d.  6. Oxycodone immediate release 5 mg one tablet t.i.d. p.r.n.   REVIEW OF SYSTEMS:  Positive for night sweats.   PHYSICAL EXAMINATION:  Well-appearing slightly overweight adult female  seated in a manual wheelchair.  Blood pressure is 132/37 with a pulse of  96, respiratory rate 18 and O2 saturation 96% on room air.  She has 5/5  strength throughout upper extremities. Hip flexion and knee extension  were 4+/5 and ankle dorsiflexion was 2/5. Ham strings were 2-/5  bilaterally.   IMPRESSION:  1. Status post thoracic epidural abscess with subsequent evacuation      with continued IV antibiotics.  2. History of left pleural empyema treated with VATS surgery March 01, 2007, and positive Lupus anticoagulant.  3. Anxiety disorder with depression.  4. Neurogenic bowel and bladder secondary to #1.   In the office today, we did give the patient a prescription for a double  upright left knee brace to be used for standing and walking. We also  refilled her Neurontin and oxycodone. We had continuation of the therapy  ongoing at this time especially since she still receives IV antibiotics.  Once IV antibiotics can be stopped, I would consider outpatient  therapies. She has made excellent progress overall and I anticipate  continued progress. Hopefully bowel and bladder function will continue  to improve, although it has been slow  at present. She still has  substantial improvement to be  made especially in lower extremity  function. She has gained substantial strength in flexion and knee  extension, but still has improvements to be made in the lower  lumbosacral regions. Will plan on seeing the patient in follow up in  approximately three months' time.           ______________________________  Ellwood Dense, M.D.     DC/MedQ  D:  05/24/2007 15:36:07  T:  05/25/2007 09:23:38  Job #:  [id number]

## 2010-12-10 NOTE — Op Note (Signed)
NAMEMarland Kitchen  MORA, PEDRAZA          ACCOUNT NO.:  [id number]   MEDICAL RECORD NO.:  [id number]          PATIENT TYPE:  INP   LOCATION:  3112                         FACILITY:  [facility]   PHYSICIAN:  Clydene Fake, M.D.  DATE OF BIRTH:  04-Jun-1971   DATE OF PROCEDURE:  02/16/2007  DATE OF DISCHARGE:                               OPERATIVE REPORT   DIAGNOSIS:  Thoracic epidural abscess.   POSTOPERATIVE DIAGNOSIS:  Thoracic epidural abscess.   PROCEDURE:  Thoracic laminectomy for evacuation of epidural abscess  thoracic spine.   SURGEON:  Clydene Fake, M.D.   General endotracheal tube anesthesia.   BLOOD LOSS:  200 mL.   BLOOD GIVEN:  None.   DRAINS:  None.   COMPLICATIONS:  None.   Tissue cultures and cultures of possible infection all sent to lab  including stat Gram stain which is not back yet.   REASON FOR ADMISSION:  The patient is a 40 year old woman who was  admitted on February 13, 2007 for chest pain, shortness of breath, some leg  weakness.  She was worked up to rule out PE since she has a history of  that in the past, which was negative.  MRI of the lumbar spine was done  and showed some degenerative changes at 4-5.  She has had progressive  weakness.  An MRI of the thoracic spine was done showing a large  epidural abscess, as well as cord compression from T5-8.  At this point  we were called for consultation, and exam showed the patient had  complete paraparesis of the lower extremities, some patchy decreased  sensation below the T5 level but there was mostly preservation of  sensation.  There was sustained clonus in the feet according to  neurology note.  Rectal tone was absent, although we did not check this.  Based on discussion with the patient and mother, we took patient  emergently the operating room for decompression of spinal cord.   PROCEDURE IN DETAIL:  The patient brought to operating room and general  anesthesia induced.  The patient was placed in a  prone position, and  flat rolls, and prepped and draped in a sterile fashion.  Needle was  placed above the thoracic spine.  AP x-ray was obtained showing this was  at the T6 level.  Incision was then made centered around where the  needle was placed, and this was taken down to the fascia.  Hemostasis  obtained with Bovie cauterization.  Subperiosteal dissection was done  over the spinous process out to the lamina bilaterally.  A self-  retaining retractor was placed at a level real close to the T6 level.  Pus came into the field from the paraspinous muscles to the left side.  This was cultured.  Laminectomy was then done and we removed a total for  complete the removal of 4 lamina, decompression of the spinal cord and  dura, removing the epidural abscess and infected looking and epidural  tissue.  At the edges of her decompression there was no further purulent  material.  We irrigated with antibiotic solution.  Good hemostasis with  Gelfoam and thrombin.  This was irrigated out.  We had good  decompression of the canal.  The cord and dura were not appreciated to  be pulsatile.  We irrigated with antibiotic solution.  We had very good  hemostasis, and the fascia closed with 0 Vicryl interrupted sutures.  Subcutaneous tissue closed with 2-0 and 3-0 Vicryl interrupted sutures.  Skin closed with staples.  Dressing was placed.  The patient was placed  back in supine position, awakened from anesthesia, transferred to the  recovery room in stable but guarded condition.           ______________________________  Clydene Fake, M.D.     JRH/MEDQ  D:  02/16/2007  T:  02/17/2007  Job:  [id number]

## 2010-12-10 NOTE — Assessment & Plan Note (Signed)
[name] returns to the clinic today for followup evaluation  accompanied by her mother.  The patient is a 40 year old female who has  a history of lupus anticoagulant.  She developed progressive numbness  and weakness in her left greater than right leg along with urinary  frequency.  Followup studies of her lumbar spine had shown a thoracic  abscess and she underwent thoracic laminectomy for evacuation of the  abscess February 16, 2007.  She was on the rehabilitation unit through  April 12, 2007.   The patient continues in home health therapy at this point.  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  She reports that her  disability expires at that time.  She would like to continue in home  health therapy at this point and then start outpatient therapy when she  returns to work.  She does need a refill on her oxycodone along with her  Neurontin and Zanaflex medications in the office today.  She also plans  to have her car adapted so that it has hand controls.  She is working in  therapy and can reportedly stand and do some dynamic standing, but  ambulates short distances with a walker at present.  She reports she  feels unsure and it not exactly sure where her legs are at all times.  The patient continues with Dr. Daiva Eves.  They have switched her from IV  antibiotics to dicloxacillin at 500 mg q.i.d.   MEDICATIONS:  1. Neurontin 400 mg t.i.d.  2. Wellbutrin XL 150 mg daily.  3. Effexor XR 225 mg daily.  4. Zanaflex 4 mg t.i.d.  5. Oxycodone immediate release 5 mg 1 tablet 5 times per day p.r.n.  6. Dicloxacillin 500 mg q.i.d.   REVIEW OF SYSTEMS:  Positive for weight gain.   PHYSICAL EXAMINATION:  GENERAL APPEARANCE:  Slightly to moderately  overweight adult female sitting in a manual wheelchair.  VITAL SIGNS:  Blood pressure 139/69, pulse 89, respiratory rate 18, and  O2 saturation 98% on room  air.  MUSCULOSKELETAL:  Hip flexion and knee extension were 4+/5 and ankle  dorsiflexion was 2/5.  Hamstrings strength was 2-/5 bilaterally.   IMPRESSION:  1. Status post thoracic epidural abscess with subsequent evacuation      with continued oral antibiotics.  2. History of left pleural empyema treated with VATS surgery March 01, 2007 with positive lupus anticoagulant.  3. Anxiety disorder with depression.  4. Neurogenic bowel and bladder secondary to #1.   In the office today, we did refill the patient's oxycodone along with  her Neurontin and Zanaflex.  We will have her continue home health  therapy through approximately August 13, 2007 then start outpatient  therapy.  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  Will plan on seeing her in followup in  approximately three months time.           ______________________________  Ellwood Dense, M.D.     DC/MedQ  D:  07/26/2007 10:45:14  T:  07/26/2007 10:59:04  Job #:  [id number]

## 2010-12-10 NOTE — Consult Note (Signed)
NAMEMarland Kitchen  [name], [name]          ACCOUNT NO.:  [id number]   MEDICAL RECORD NO.:  [id number]          PATIENT TYPE:  INP   LOCATION:  2550                         FACILITY:  [facility]   PHYSICIAN:  Charlcie Cradle. Delford Field, MD, FCCPDATE OF BIRTH:  Feb 28, 1971   DATE OF CONSULTATION:  03/01/2007  DATE OF DISCHARGE:                                 CONSULTATION   CRITICAL CARE CONSULTATION:   INDICATION:  Postop check.   HISTORY OF PRESENT ILLNESS:  This is a 40 year old female who initially  was admitted on February 13, 2007, for back pain that was intractable.  She  has seen a chiropractor and her primary care physician, was then  admitted with numbness, left greater than right, of the lower  extremities.  At the time, she was evaluated.  She underwent a lumbar  MRI, which showed some degenerative disk disease, central herniated  disk, L4-L5.  Orthopedics suggested neurosurgical consultation.  Ultimately, neurology saw the patient, then neurosurgery.  An extended  MRI was done of the T spine showing epidural abscess on the thoracic  location.  She went emergently to surgery on February 16, 2007.  Drainage  was performed.  She still had significant paraparesis and dysesthesias  of the lower extremities.  While recuperating for this, she developed  increasing left-sided chest pain, was ultimately found to have a left  pleural empyema, likely an extension from the thoracic epidural abscess.  She underwent decortication and VATS procedure today after a chest tube  failed to reduce the effusion collection.  She was found to have  significant empyema and underwent decortication on today's visit to the  OR, March 01, 2007.  We were asked by thoracic surgery to see this  patient postop.  Postop, the patient was seen in the recovery room and  is stable and extubated.  The patient has tolerated the procedure  relatively well.   PAST MEDICAL HISTORY:  1. Medical history of deep venous thrombosis.  2.  Pulmonary embolism after knee surgery, 2004.  3. History of lupus anticoagulant.  4. Left eye macular degeneration.  5. Cervical dysplasia.  6. Shingles.  7. Previous arthroscopic surgery of the left knee.  8. Cystectomy and cholecystectomy and tonsillectomy and appendectomy.   MEDICATIONS:  Allergy to CODEINE.   FAMILY HISTORY:  Positive for DVT.   SOCIAL HISTORY:  She quit smoking 2 years ago.  She works at [employer], [employer].  Is single with 2 children.   PHYSICAL EXAM:  GENERAL:  This is a post anesthetic patient,  extubated  and stable.  Saturation 97% on 2 liters.  CHEST:  Showed decreased breath sounds at the bases.  No wheezes or  rhonchi.  VITAL SIGNS:  Blood pressure 118/74, respirations 15.  CARDIAC EXAM:  Regular rate and rhythm without S3.  Normal S1, S2.  ABDOMEN:  Soft, nontender.  EXTREMITIES:  Showed no edema or clubbing.  SKIN:  Clear.  NEUROLOGICALLY:  The patient has minimal movement of the lower  extremities.   LABORATORY DATA:  CAT scan of the chest that showed loculated left  pleural effusion.  Postop films show  adequate drainage of the left  pleural effusion, chest tubes in place.  White count 12,300, hemoglobin  10.8.  Sodium on February 23, 2007, of 129, potassium 4.7, chloride 91, CO2  of 29.  BUN 15, creatinine 0.7.  Blood sugar 118.  Cultures from  previous thoracic spine drainage showed Staph aureus, methicillin-  sensitive.   IMPRESSION:  1. Methicillin-sensitive Staphylococcus aureus epidural abscess status      post drainage on February 16, 2007, now with left pleural empyema.  2. Status post decortication.   PLAN:  Obtained a CMET today.  Followup x-ray.  Continue pulmonary  toilet.  She is at high risk for deep venous thrombosis, pulmonary  embolism.  She is off Lovenox now and need to restart Lovenox as soon as  it is feasible postop because of previous history of pulmonary embolism  and deep venous thrombosis and the lupus  anticoagulant.  We will follow  with you in the intensive care unit.      Charlcie Cradle Delford Field, MD, [facility]  Electronically Signed     PEW/MEDQ  D:  03/01/2007  T:  03/01/2007  Job:  [id number]   cc:   Corinna L. Lendell Caprice, MD  Karle Plumber, MD  Sigmund Hazel, M.D.  Pierce Crane, M.D.  Lubertha Basque Jerl Santos, M.D.

## 2010-12-10 NOTE — H&P (Signed)
NAMEMarland Kitchen  LOA, IDLER          ACCOUNT NO.:  [id number]   MEDICAL RECORD NO.:  [id number]          PATIENT TYPE:  INP   LOCATION:  5008                         FACILITY:  [facility]   PHYSICIAN:  Corwin Levins, MD      DATE OF BIRTH:  [birth year]   DATE OF ADMISSION:  02/13/2007  DATE OF DISCHARGE:                              HISTORY & PHYSICAL   CHIEF COMPLAINT:  Worsening low back pain and urinary tract infection  symptoms for one to three days.   HISTORY OF PRESENT ILLNESS:  [name] is a 40 year old white female  her at [employer] .  She saw her chiropractor for lower back discomfort  July 16 and July 17 with adjustments made that seemed possibly temporary  help, but appeared to be minimal.  She called her primary MD late July  17 and got Flexeril, Percocet and Naprosyn apparently called in.  Since  that time, however, she has developed worsening shakiness and weakness  of the legs, left greater than the right associated with numbness as  well.  She described some anesthesia at the waistline as well.  Incidentally, she has also had some GU symptoms for the last 24 hours  with urinary frequency in small amounts, not sure of pain because of the  anesthesia.  No urine in her blood.  Last UTI approximately two weeks  ago, no history of renal stones.   PAST MEDICAL HISTORY/ILLNESSES:  1. History of DVT/pulmonary embolus after left knee surgery 2004.  2. History of positive lupus anticoagulant.  3. Anxiety/depression.  4. History of shingles.  5. History of cervical dysplasia status post LEEP.  6. History of left eye macular degeneration with 1998 surgery.  7. Status post right knee arthroscopic surgery in 1988.  8. Status post cholecystectomy.  9. Status post T&A.  10.She states she has an IUD in place.   ALLERGIES:  CODEINE.   CURRENT MEDICATIONS:  1. Effexor XR 75 mg three p.o. daily.  2. Wellbutrin XL 150 mg p.o.daily.  3. Percocet 5/325 p.r.n.  4. Flexeril 10 mg 1/2 to  1 p.r.n.  5. Naprosyn 550 one t.i.d. p.r.n.   SOCIAL HISTORY:  Tobacco:  Quit two years.  Alcohol:  Rare.  Single, two  children, works at the [employer].   FAMILY HISTORY/REVIEW OF SYSTEMS:  Otherwise, significant only for a  grandfather with DVT in his 90's, father with renal stones.   PHYSICAL EXAMINATION:  VITAL SIGNS:  She is afebrile.  Blood pressure  116/75, heart rate 88, respirations 20, O2 saturation 100%.  ENT EXAM:  Sclerae clear.  TMs Clear.  Oropharynx benign.  NECK:  No lymphadenopathy, JVD or thyromegaly.  CHEST:  No rales or wheezes.  CARDIAC EXAM:  Regular rate and rhythm.  ABDOMEN:  Soft.  Positive bowel sounds.  There is marked suprapubic  tenderness.  EXTREMITIES:  No edema.  NEUROLOGICAL:  She is alert and oriented x3, otherwise intact except for  obvious distal 4/5 weakness of the left lower extremity.   LABS:  CT angio of the chest:  No PE.   D-dimer 3.97, slightly elevated.  Chest x-ray:  No acute disease.   UA with 21-50 white blood cells, many bacteria, large leukocyte  esterase.  Electrolytes within normal limits.  BUN 5, creatinine 0.9,  glucose 109, white blood cell count 12.4, hemoglobin 14.9.   ECG:  Sinus tachycardia with heart rate of 107.   ASSESSMENT/PLAN:  1. Intractable low back pain with left lower extremity weakness and      numbness.  This is most likely secondary to lumbosacral spine disc      disease with radiculitis/radiculopathy evolving over the last      several days now with what sounds like even saddle anesthesia.      Will check lumbosacral spine MRI.  She will need pain control,      immobilize that day.  I suspect she may need orthopedic consult as      an inpatient.  I doubt malignant causes as there is no history of      cancer.  2. Urinary tract infection.  Check urine culture.  Give IV Cipro      tonight.  3. Left chest pain consistent with musculoskeletal discomfort. Will      afford pain  control as above.  4. Anxiety/depression.  Continue home medications.  Add p.r.n. Xanax.  5. Prophylaxis.  Give Lovenox subcutaneously and start proton pump      inhibitor.  6. Code status is full.  7. Disposition.  She has two children at home for which she is solely      responsible; I think mother is to cover tonight, but patient wants      to leave as soon as stabilized.      Corwin Levins, MD  Electronically Signed     JWJ/MEDQ  D:  02/13/2007  T:  02/14/2007  Job:  [id number]   cc:   Sigmund Hazel, M.D.  Pierce Crane, M.D.  Lubertha Basque Jerl Santos, M.D.

## 2010-12-10 NOTE — Op Note (Signed)
NAMESOLACE, MANWARREN          ACCOUNT NO.:  [id number]   MEDICAL RECORD NO.:  [id number]          PATIENT TYPE:  INP   LOCATION:  2550                         FACILITY:  [facility]   PHYSICIAN:  Ines Bloomer, M.D. DATE OF BIRTH:  1971-07-02   DATE OF PROCEDURE:  DATE OF DISCHARGE:                               OPERATIVE REPORT   PREOPERATIVE DIAGNOSES:  1. Diabetes mellitus.  2. Status post thoracic epidural abscess.  3. Left chest empyema.   POSTOPERATIVE DIAGNOSES:  1. Diabetes mellitus.  2. Status post thoracic epidural abscess.  3. Left chest empyema.   OPERATION PERFORMED:  1. Left VATS .  2. Left thoracotomy.  3. Decortication and drainage of left chest empyema.   SURGEON:  Dr. Dorita Sciara.   FIRST ASSISTANT:  Cherlynn Perches, RN FA.   DESCRIPTION OF PROCEDURE:  After general anesthesia, the patient was  prepped and draped in the usual sterile manner, was turned to the left  lateral thoracotomy position.  The trocar site was made in the posterior  axial line at the 7th intercostal space.  A dual lumen tube was then  inserted.  The left lung was deflated.  Entering the pleural cavity,  there was an area of an empyema with a marked amount of exudate.  We  could not even get the scope in to be sure about the empyema, so we just  converted to a posterolateral thoracotomy to the 6th intercostal space,  dividing the latissimus and reflecting the serratus anteriorly and  entering the 6th intercostal space.  A Finochietto retractor was  inserted.  On entering that, we did a decortication, removing a lot of  exudate from the posterior mediastinum, and then did a pleurectomy  posteriorly, dissecting up the large mediastinum.  The lung was  dissected up superiorly and medially, dissecting off the mediastinum and  then off the diaphragm.  The inferior pulmonary ligament was taken down.  After all pleural exudate had been removed, we then proceeded with a  decortication,  which was primarily of the lower lobe, but was a mild  part of the posterior segment of the left upper lobe.  The fissure was  first freed up and then we did a decortication using forceps to strip  off a thickened 3-4 mm pleura until we completely freed up the left  lower lobe.  Several small tears in the lung were sutured with 3-0  Vicryl.  Three chest tubes were placed, one through a previous chest  tube site anteriorly, it was a #36 chest tube, and it was sutured in  place.  One new stab wound was made for a right angle chest tube in the  cardiophrenic angle and then a posterior chest, #36 chest tube.  An On-Q  was inserted in the usual fashion, a single On-Q, and a Marcaine block  was done in  the usual fashion.  The chest was reexpanded.  The lungs were  reexpanded.  The chest was closed with a 4-0 pericostal, drilling  through the 6th rib and passing around the 5th rib; #1 Vicryl in the  muscle layer, 2-0 Vicryl  in the subcutaneous tissue, and nylon staples  for the skin.  Patient was returned to the SICU in serious condition.      Ines Bloomer, M.D.  Electronically Signed     DPB/MEDQ  D:  03/01/2007  T:  03/01/2007  Job:  [id number]

## 2010-12-10 NOTE — H&P (Signed)
NAMERICHARD, HOLZ          ACCOUNT NO.:  [id number]   MEDICAL RECORD NO.:  [id number]          PATIENT TYPE:  IPS   LOCATION:  4004                         FACILITY:  [facility]   PHYSICIAN:  Ranelle Oyster, M.D.DATE OF BIRTH:  March 19, 1971   DATE OF ADMISSION:  03/09/2007  DATE OF DISCHARGE:                              HISTORY & PHYSICAL   CONSULTING PHYSICIANS:  1. Dr. Edwyna Shell  2. Dr. Delford Field  3. Dr. Phoebe Perch  4. Dr. Maurice March  5. As well as Dr. Sharene Skeans   CHIEF COMPLAINT:  Weakness and back pain.   HISTORY OF PRESENT ILLNESS:  This is a 40 year old white female with  history of lupus anticoagulant and DVT and low back pain developing mid-  July.  Patient had progressive weakness and numbness, left greater than  right with urinary frequency.  She was admitted on July 19 for workup  and MRI revealed a large epidural abscess with cord compression at T5-  T8.  Patient underwent a thoracic laminectomy for evacuation of epidural  abscess on July 22.  Wound culture positive for staph aureus.  Patient  was placed on IV Vancomycin which was eventually changed to Zinacef with  planned completion upon August 16.  Hospital course was complicated by  left chest discomfort and patient found to have a large pleural empyema.  Patient underwent a left VATS/thoracotomy and drainage of empyema on  August 4 by Dr. Edwyna Shell.  Patient was placed on Coumadin for DVT  prophylaxis.  Patient has continued to have difficulties with bowel and  bladder incontinence as well as mobility and; thus, was admitted to  inpatient rehab unit today to improve upon these functional medical  problems.   REVIEW OF SYSTEMS:  Notable for constipation and insomnia.  She has left  chest pain.  Denies any back pain at this point.  Denies any shortness  of breath or coughing.  Other pertinent positives listed above.  Full  review is in the written H&P.   PAST MEDICAL HISTORY:  Positive for:  1. Anxiety/depression.  2.  History of DVT post knee surgery in 2004.  3. Positive lupus anticoagulant.  4. Shingles.  5. Cholecystectomy.  6. Left eye surgery.  7. TNA.  8. Cervical dysplasia status post LEEP.  9. Patient used tobacco remotely, but does not drink.   FAMILY HISTORY:  Noncontributory.   SOCIAL HISTORY:  Patient lives with mother.  She has two children age 52  and 45 old.  She worked for [employer]  prior to admission.  She has a two-level house and bathroom upstairs.  Patient will live with mother upon discharge.   FUNCTIONAL STATUS:  Patient is requiring max assist for basic mobility  and transfers.  Functional history prior to injury was independent.   ALLERGIES:  CODEINE.   HOME MEDICATIONS:  1. Effexor XR 75 mg three tablets daily.  2. Wellbutrin XL 150 mg daily.  3. Percocet p.r.n.  4. Flexeril p.r.n.  5. Naprosyn p.r.n.   LABS:  Hemoglobin 9.8, white count 8.6, platelets 482, 000, INR 2.4, BUN  4, creatinine 0.4, sodium 138, potassium 4.2.  PHYSICAL EXAMINATION:  VITAL SIGNS:  Blood pressure is 135/54, pulse 83,  respiratory rate 20, temperature 97.  GENERAL:  Patient is pleasant, alert and oriented x3.  She is morbidly  obese.  EAR/NOSE/THROAT EXAM:  Unremarkable with good dentition and pink, moist  oral mucosa.  NECK:  Supple without JVD or lymphadenopathy.  CHEST:  Clear to auscultation bilaterally with fair air movement.  Patient did have some discomfort on the left chest due to thoracotomy  wound.  HEART:  Regular rate and rhythm without murmurs, rubs, gallops.  ABDOMEN:  Soft, nontender.  Bowel sounds were positive.  EXTREMITIES:  Showed trace to 1+ edema in bilateral lower extremities.  SKIN:  Notable for two tears along the gluteal folds.  Thoracic surgical  incision was well healed and intact.  Left thoracotomy incision was  healed.  No other signs of breakdown were seen on examination today.  NEUROLOGICALLY:  Cranial nerves II-XII  are intact.  Reflexes are 3+ in  the lower extremities.  She had sustained clonus of the right ankle with  range of motion of the ankle today.  Sensory function was 1/2 in both  legs, essentially symmetrical.  She had a sensory level of approximately  T6.  Motor function was 5/5 in the upper extremities.  Lower extremity  motor function was 1+ to 2/5 in the hips and knees.  She was a bit  stronger at the ankles with left ankle dorsiflexion and plantar flexion  of approximately 3/5.  Right ankle plantar flexion of 3/5 and ankle  dorsiflexion of 1+ to 2/5.  Judgment, orientation, memory and mood were  all within normal limits.   ASSESSMENT/PLAN:  1. Functional deficits secondary to T5-T8 epidural abscess,      essentially T6 incomplete spinal cord injury:  Begin comprehensive      inpatient rehab with physical therapy to assess and treat for range      of motion, strengthening, gait and equipment.  Occupational therapy      will assess and treat for range of motion, strengthening, splinting      and appropriate equipment.  Rehab nurse will follow on a 24-hour      basis for skin, bowel, bladder and wound issues.  Neuropsychology      will see as needed for adjustment reaction.  Case management/social      worker will assess for psychosocial needs and discharge planning.      Estimated length of stay is three to four weeks.  Goals:      Supervision and min assist at +/- wheelchair level.  Prognosis is      fair to good.  2. Deep vein thrombosis prophylaxis:  Coumadin.  3. Left pleural empyema:  Status post video-assisted thoracoscopic      surgery.  4. Infectious disease:  Plan for now is to continue IV Zinacef until      September 22 with followup MRI and labs at that point.  5. Anxiety/depression:  Continue Effexor, Wellbutrin and Xanax.  6. Neurogenic bowel and bladder:  Will begin voiding trial, check      postvoid residuals as appropriate.  Also, patient needs to be on a       regular bowel program as well.  7. Pain management:  Continue p.r.n. oxycodone and Flexeril.  8. Anemia:  Aranesp and NuIron supplements.  9. Positive lupus anticoagulant.      Ranelle Oyster, M.D.  Electronically Signed     ZTS/MEDQ  D:  03/09/2007  T:  03/09/2007  Job:  [id number]

## 2010-12-10 NOTE — Consult Note (Signed)
NAMEMarland Kitchen  JUNA, CABAN          ACCOUNT NO.:  [id number]   MEDICAL RECORD NO.:  [id number]          PATIENT TYPE:  INP   LOCATION:  3112                         FACILITY:  [facility]   PHYSICIAN:  Deanna Artis. Hickling, M.D.DATE OF BIRTH:  04-07-1971   DATE OF CONSULTATION:  02/16/2007  DATE OF DISCHARGE:                                 CONSULTATION   CHIEF COMPLAINT:  Weakness in legs.   HISTORY OF PRESENT CONDITION:  [name] Lawrence is a 40 year old woman  who had onset of discomfort in her mid to low back July 16 and 17.  She  was seen by a chiropractor who made adjustments, but she only had  temporary relief.  She saw her primary physician July 17 and was treated  with Flexeril, Percocet, Naprosyn.   The patient presented for admission to [facility] 13, 2007, with shakiness and weakness in her legs, left greater than right,  and numbness.  The patient said that she had  anesthesia at the  waistline as well.  She had problems with increased urinary frequency in  small amounts.  Her last urinary tract infection was 2 weeks prior to  admission.   The patient on examination was noted to have 4/5 weakness in her left  lower extremity.  No other details were provided.   The patient was seen over the weekend by Dr. Donnalee Curry who noted  that she had increased back pain and paresthesias in her legs as well as  weakness.  She noted that [name] Lawrence' extremities were cool. There was  no edema.  There was decreased sensation and weakness.  MRI scan of the  lumbosacral spine showed disk degeneration at L4-5 with a small central  inferior annular tear along the posterior disk margin and protrusion  with caudal migration.  There was some contact of the left lateral  recess but no direct nerve root impingement.  There was mild facet  degeneration at L4-L5.  No other abnormalities were noted.   The patient continued to deteriorate.  She had been able to walk  independently on Saturday and on Sunday; however, her back pain had  increased.  She had evidence of low-grade fever with temperature of  100.2.  She was seen by physical therapy and also by Dr. Marcene Corning.  She considered a neurological evaluation but did not call.  The patient  had a urinary tract infection and was on antibiotics.   Dr. Jerl Santos evaluated the patient at 6:00 p.m. on July 21 and noted  that the patient had significant motor impairments with weakness in her  ankle dorsiflexion, knee extension, hip flexion and also ankle clonus.  He recommended a neurosurgical consult now and talked with Dr. Suanne Marker  who called Dr. Newell Coral.   I was contacted this morning to see the patient based on Dr. Earl Gala  request that neurology evaluate her.  At this point, she has become  flaccid (12 noon).  She is unable to move her legs.  She has decreased  rectal tone, and her bladder is catheterized.   PAST MEDICAL HISTORY:  Remarkable for:  1. Deep  vein thrombosis and pulmonary embolus after left knee surgery      in 2004.  2. History of positive lupus anticoagulant.  3. History of shingles.  4. History of cervical dysplasia.  5. History of left eye macular degeneration.   PAST SURGICAL HISTORY:  1. LEEP for of cervical dysplasia.  2. Arthroscopic surgery of the left knee in 2004.  3. Eye surgery in 1998 for macular degeneration.  4. Arthroscopic knee surgery in 1988.  5. Cholecystectomy.  6. Tonsillectomy.  7. Adenoidectomy.  8. The patient has an IUD in place.   MEDICATIONS ON ADMISSION:  1. Effexor XR 75 mg three daily.  2. Wellbutrin XL 150 mg daily.  3. Percocet 5/325 as needed.  4. Flexeril 10 mg 1/2 to 1 as needed.  5. Naprosyn 550 mg three times a day as needed.   DRUG ALLERGIES:  CODEINE.   FAMILY HISTORY:  Grandfather had DVT in his 45s.  Father has renal  stones.  Family history otherwise negative.   SOCIAL HISTORY:  The patient quit using tobacco 2 years  ago.  She drinks  alcohol.  She is single with two children.  She works at [employer].   PHYSICAL EXAMINATION:  VITAL SIGNS:  On examination today, temperature  100.6, blood pressure 120/74, resting pulse 110, respirations 18, oxygen  saturation 97%.  EARS, NOSE, THROAT:  No infections.  NECK:  Supple.  No bruits.  LUNGS:  Clear.  HEART:  No murmurs.  Pulses normal.  ABDOMEN:  Soft.  Bowel sounds normal.  No hepatosplenomegaly.  Abdomen  is protuberant.  EXTREMITIES:  Tender legs, right greater than left proximally. No edema  and no cords palpated. Her legs are pink and warm.  NEUROLOGIC:  Mental Status:  Alert without dysphasia.  Cranial Nerves:  Round reactive pupils.  She has a left afferent pupillary defect.  Visual fields are full.  She has only peripheral vision in the left eye.  She has normal vision in her right eye.  Symmetric facial strength.  Midline tongue and uvula.  Motor Examination:  Upper extremity strength  is normal.  Lower extremity strength 0/5.  Sensory level T3-T4.  Cerebellar Examination:  Finger-to-nose was okay.  Deep tendon reflexes:  She has clonus at the ankles that is sustained.  She has a left extensor  and right neutral plantar response.   IMPRESSION:  1. Thoracic myelopathy, rapidly progressive, unknown etiology.      Differential diagnoses include herniated nucleus pulposus, abscess,      tumor, less likely infarction of the spinal cord, dysmyelinating      lesion or syrinx.  Plan stat T-spine MRI without and with.  2. If lesion is not seen, then we will look at the C-spine, although I      think we will find a lesion in the upper thoracic region.  We will      sedate her with Ativan for the procedure.  I have contacted Dr. Orbie Hurst, on call      for [facility], and he has recommended C-reactive      protein, erythrocyte sedimentation rate, PT and PTT. We have given      her dexamethasone 100 mg IV now  and call is in place to Dr. Suanne Marker      to inform her of the patient's condition.      Deanna Artis. Sharene Skeans, M.D.  Electronically Signed     WHH/MEDQ  D:  02/16/2007  T:  02/16/2007  Job:  [id number]   cc:   Corwin Levins, MD  Kela Millin, M.D.  Lubertha Basque Jerl Santos, M.D.  Hewitt Shorts, M.D.  Clydene Fake, M.D.

## 2010-12-10 NOTE — Assessment & Plan Note (Signed)
[name] returns to clinic today for planned Botox injections in her  right lower extremity.  She has had toe flexion problems related to her  thoracic epidural abscess.  We discussed during the last clinic visit on  May 05, 2008, possible Botox injections into her right calf  specifically in the toe flexors.  We have been approved for that and she  is back in the office today for that injection.   In terms of her pain medicine, she reports she is not getting as  sufficient relief from her pain as she was previously using the  oxycodone 5 mg 4 times per day.  She would like an adjustment of that  medication if possible.   MEDICATIONS:  1. Neurontin 400 mg t.i.d.  2. Effexor XR 225 mg daily.  3. Zanaflex 4 mg t.i.d. p.r.n.  4. Oxycodone immediate release 5 mg 1 tablet 4-5 times per day p.r.n.  5. Oxybutynin 1/2 tablet daily.   REVIEW OF SYSTEMS:  Noncontributory.   PHYSICAL EXAMINATION:  GENERAL:  Well-appearing adult female in mild  acute discomfort.  VITAL SIGNS:  Blood pressure 145/71 with pulse of 88, respiratory rate  18, and O2 saturation 96% on room air.  EXTREMITIES:  She transfers from wheelchair to the exam table with  standby assistance.  She is able to propel a wheelchair well at the  present time.  She has increased tone in her right calf especially in  the area of the flexor digitorum longus and the flexor hallucis longus.   IMPRESSION:  1. Spasticity of the right posterior calf especially in the area of      the deep toe flexors.  2. Status post thoracic epidural abscess with subsequent evacuation      with continued oral antibiotics.  3. History of left pleural empyema treated with video-assisted      transthoracic surgery on March 01, 2007, with positive lupus      anticoagulant.  4. Anxiety disorder with depression.  5. Neurogenic bowel and bladder secondary to spasticity of the right      posterior calf.   In the office today, we did have the  patient's sign of consent for Botox  injections in her right posterior calf.  I have prepared 2 vials each  with 100 units.  The lot numbers were [id number] and expiration date was  April 2011 for each vial.  I cleansed the right posterior calf and  injected the 200 International Units individually, 100 International  Units were placed in the medial aspect on the area of the flexor  digitorum longus and the other 100 units were placed in the lateral  aspect of her calf in the area of the flexor hallucis longus.  The  patient tolerated the procedure well.  Good hemostasis was obtained at  each injection site.   We also refilled the patient's oxycodone and increased the dose up to 5  mg 1-2 tablets q.i.d. p.r.n. to maximum of 8 per day, prescribed today  June 08, 2008.   We will plan on seeing the patient in followup in this office in  approximately 3 months' time.           ______________________________  Ellwood Dense, M.D.    DC/MedQ  D:  06/08/2008 12:04:45  T:  06/09/2008 01:09:20  Job #:  [id number]

## 2010-12-10 NOTE — Discharge Summary (Signed)
[name], [name]          ACCOUNT NO.:  [id number]   MEDICAL RECORD NO.:  [id number]          PATIENT TYPE:  INP   LOCATION:  3308                         FACILITY:  [facility]   PHYSICIAN:  Michelene Gardener, MD    DATE OF BIRTH:  05/22/1971   DATE OF ADMISSION:  02/13/2007  DATE OF DISCHARGE:  03/09/2007                               DISCHARGE SUMMARY   PRIMARY PHYSICIAN:  Dr. Blanche East.   DISCHARGE DIAGNOSES:  1. Epidural abscess in T5-T8 status post evacuation on February 16, 2007.  2. Methicillin-sensitive Staphylococcus aureus.  The patient will be      continued on intravenous Zinacef until April 19, 2007.  3. Left pleural empyema status post chest tube on March 01, 2007.  4. Positive lupus anticoagulant.  5. Deep venous thrombosis.  The patient on chronic anticoagulation.  6. Anxiety.  7. Depression.  8. Obesity.  9. Neurogenic bowel and bladder.  10.Anemia.  11.Yeast infection.  12.History of pulmonary embolism and the patient is on Coumadin      therapy.   DISCHARGE MEDICATIONS:  1. Coumadin 5 mg once daily and Coumadin has to be adjusted by primary      physician.  2. Protonix 40 mg p.o. once daily.  3. Wellbutrin XL 150 mg p.o. once daily.  4. Effexor XR 225 mg p.o. once daily.  5. Fibercon 1250 mg p.o. once daily.  6. Neurontin 400 mg p.o. twice daily.  7. MiraLax 17 g p.o. once daily.  8. Desyrel 25 mg p.o. at night time as needed.  9. Zanaflex 4 mg p.o. q.8h.  10.Xanax 0.5 mg p.o. q.8h. as needed for anxiety.  11.IV Zinacef 1.5 g until April 19, 2007.  12.Oxycodone immediate-release 5 mg one to two tablets every 4 hours      as needed for pain.   CONSULTATIONS:  1. Neurology consult by Dr. Ellison Carwin done on February 16, 2007.  2. Vascular surgery consult by Dr. Jovita Gamma on July 30.  3. Pulmonary consult by Dr. Delford Field on March 01, 2007.   PROCEDURES:  1. Thoracic laminectomy for evacuation of epidural abscess done on      July 22 by Dr.  Colon Branch.  2. Chest tube insertion on August 4.   RADIOLOGY STUDIES DURING THE HOSPITALIZATION:  1. Chest x-ray on February 13, 2007, showed normal findings.  2. Repeat x-ray on July 19 showed bibasilar atelectasis.  3. CT angiography on July 19 showed no evidence of pulmonary embolism      and showed very small left pleural effusion with mild to moderate      bibasilar atelectasis and showed hepatomegaly.  4. MRI of the lumbar spine without contrast showed L4-L5 disk      degeneration including annular tear and small central protrusion      with slight caudal migration.  5. Thoracic spine x-ray showed no acute findings.  6. MRI of the thoracic spine showed findings compatible with large mid      thoracic epidural abscess extending from T5-T8 inferiorly with      severe cord compression at levels of T6 and T7, and  it showed also      extensive left-greater-than-right paraspinal inflammation      suggestive of infectious myositis and possible early paraspinal      muscle abscess.  7. Repeat CT angiography done in July 27 showed no evidence of      pulmonary embolism and showed mild congestion, a moderate increased      size in the left pleural effusion, and increased left lower lobe      atelectasis.  8. Repeat chest x-ray on July 27 showed no pneumothorax.  9. Repeat x-ray on July 29 showed enlarged left pleural effusion with      associated left lung atelectasis.  10.CT scan of the abdomen with contrast on July 30 showed benign-      appearing abdomen.  11.CT scan of the chest with contrast showed increased left pleural      effusion with complete atelectasis of the left lower lobe secondary      to compression by the effusion, and it showed decrease in the heart      size and mild edema.  12.Chest x-ray on July 30 showed chest tube placed with persistent      large left pleural effusion and decreased aeration of the left      lung.  13.Chest x-ray on July 31 showed a left pleural  effusion.  14.X-ray on August 1 showed increase in left pleural effusion.  15.Chest x-ray on August 2 showed same findings from previous x-ray.  16.CT of chest without contrast on August 3 showed persistent moderate      posterior loculated left pleural effusion with tiny right pleural      effusion.  17.Repeat x-ray on August 4 showed bilateral airspace disease with      interval decrease in left pleural effusion and three left chest      tubes in place.  18.Repeat x-ray on August 5 showed persistent left pleural effusion.  19.X-ray on August 7 showed the chest tubes repositioned, there is no      pneumothorax, and showed right basilar atelectasis.  20.Repeat x-ray on August 7 showed slight worsening left side aeration      with increased mid lower lung zone airspace disease.  21.Repeat x-ray on August 8 showed two left-sided chest tubes without      evidence of pneumothorax and showed slight improved aeration in the      left side.  22.Repeat x-ray on August 9 showed mild decrease in the pleural      effusion.  23.Repeat x-ray on August 11 showed no significant change in x-ray.  24.Repeat x-ray August 10 showed chest tube removal without evidence      of pneumothorax.  25.Chest x-ray on August 12 showed no significant change.   COURSE OF HOSPITALIZATION:  #1 - EPIDURAL ABSCESS IN T5-T8.  This  patient had presented to the hospital complaining of lower back pain.  This patient has been admitted for further evaluation.  MRI of the  lumbar spine and thoracic spine was done and results were mentioned  above.  The patient was seen by neurosurgery and underwent evacuation of  the abscess on February 16, 2007.  Following that, the patient has been  followed daily by neurosurgery.  PT evaluation was started in the  hospital.  The patient was discharged to rehab at [facility] on March 09, 2007, for further evaluation.   #2 - METHICILLIN-SENSITIVE STAPHYLOCOCCUS AUREUS.  The cultures from  the  abscess showed positive  methicillin-resistant Staphylococcus aureus.  The patient was seen by ID during the hospitalization and antibiotic has  been adjusted by infectious disease.  The patient was started on  intravenous Zinacef and that will be continued up to September 22.  The  patient has a PICC line in place and will be continued on that until  September 22.   #3 - LEFT PLEURAL EMPYEMA.  As mentioned above, this patient has  developed empyema and has been followed by serial x-rays during the  hospital.  X-ray results on followup were mentioned above..  The patient  was evaluated by surgery and underwent chest tube insertion in March 01, 2007.  Chest tube was removed on August 10.  The patient again will be  followed by serial x-ray as an outpatient and will be followed by  primary physician and surgery.   #4 - HISTORY OF DEEP VENOUS THROMBOSIS.  The patient maintained on  Coumadin during the hospital, was discharged on Coumadin and therapeutic  INR is recommended between 2-3.   #5 - ANXIETY AND DEPRESSION.  The patient was treated with the  medications.   DISPOSITION:  Otherwise, other medical conditions were stable during the  hospital.  The patient will be discharged to rehab at [facility] and  will be continued on antibiotics until September 22.  Again, will be on  pain medications, will be followed as an outpatient with primary  physician, neurosurgery, ID, and rehab medicine.   Total assessment time is 20 minutes.      Michelene Gardener, MD  Electronically Signed     NAE/MEDQ  D:  06/18/2007  T:  06/19/2007  Job:  [id number]

## 2010-12-10 NOTE — Consult Note (Signed)
[name], [name]          ACCOUNT NO.:  [id number]   MEDICAL RECORD NO.:  [id number]          PATIENT TYPE:  INP   LOCATION:  3301                         FACILITY:  [facility]   PHYSICIAN:  Ines Bloomer, M.D. DATE OF BIRTH:  [birth year]   DATE OF CONSULTATION:  DATE OF DISCHARGE:                                 CONSULTATION   REFERRING PHYSICIAN:  Corinna L. Lendell Caprice, MD   CHIEF COMPLAINT:  Dyspnea.   HISTORY OF PRESENT ILLNESS:  This 40 year old patient with multiple  medical problems presented with a urinary tract infection and weakness  in her legs secondary to paravertebral abscess in which she underwent  emergency surgery by Dr. Orbie Hurst with a thoracic laminectomy and  decompression of epidural space from T5 to T8.  Postoperatively, she was  noted to very severe weakness in lower extremities and this has been  followed postoperatively.  She started getting increasing shortness of  breath with an increasing white count and temperature.  CT scan 2 days  ago showed a left pleural effusion that was not treated and she has  increased left pleural effusion with more dyspnea.  Her temperature last  night was 102.  Empyema of the left chest is suspected.  A repeat CT  scan was done which showed increasing effusion.   PAST MEDICAL PERIOD:  1. Deep venous thrombosis with a pulmonary embolus after knee surgery      in 2004.  2. She has a positive lupus anticoagulant.  3. She has a history of left eye macular degeneration.  4. History of cervical dysplasia.  5. History of shingles.  6. She has had previous arthroscopic surgery of left knee.  7. Eye surgery.  8. Arthroscopic surgery in 1998.  9. Cholecystectomy.  10.Tonsillectomy.  11.Appendectomy.   ALLERGIES:  She is allergic to CODEINE.   MEDICATIONS:  Effexor, Wellbutrin, Percocet and in the hospital, she has  been on insulin, Lovenox, Lantus and Ancef.   FAMILY HISTORY:  Positive for DVT.   SOCIAL HISTORY:  She  quit smoking 2 years ago.  Occasional alcohol.  She  is single with 2 children and works at [employer].   REVIEW OF SYSTEMS:  Recorded in the history of present illness.   PHYSICAL EXAM:  VITAL SIGNS:  Her blood pressure is 110/70, temperature  100, pulse 100.  SATs were 92%.  HEENT:  Unremarkable.  NECK:  Supple without thyromegaly.  LYMPHATICS:  There is no supraclavicular or axillary adenopathy.  CHEST:  There are decreased breath sounds, generalized.  HEART:  Regular sinus rhythm, no murmur.  ABDOMEN:  Soft.  There is no splenomegaly.  EXTREMITIES:  There is no tenderness, no edema, 1+ pulses.  NEUROLOGICAL:  She has been recorded, but she has weakness in her lower  extremities.  SKIN:  Without lesions.   IMPRESSION:  1. Left pleural effusions after thoracic epidural abscess drainage,      rule out empyema.  2. Diabetes mellitus.  3. Thoracic myelopathy.  4. History of pulmonary embolus.  5. History of shingles.   PLAN:  Insertion of left chest tube.  Ines Bloomer, M.D.  Electronically Signed     DPB/MEDQ  D:  02/24/2007  T:  02/25/2007  Job:  [id number]

## 2010-12-10 NOTE — Assessment & Plan Note (Signed)
HISTORY:  Ms. Myricks returns to clinic today for followup evaluation  accompanied by her mother.  Overall, she is doing fairly well.  She did  see Dr. Sharene Skeans, a local neurologist, who apparently gave her a poor  prognosis for complete recovery.  She continues to do her bowel program  on a daily basis and continues to see Dr. Annabell Howells, local urologist.  She  is using oxybutynin for improvement in bladder function.   In terms of her medication, she continues to use the Neurontin 400 mg  t.i.d. along with Zanaflex only on an as-needed basis.  She does need to  refill on her oxycodone immediate release, which she uses approximately  4-5 times per day.   The patient's other main complaint is tight sensation of her right  foot.  She does use a heating pad on that right foot and wonders about  Botox injections for curling of her right toes.  She also reports that  she has impaired balance when she tries to stand, but has fairly good  strength in her lower extremities.   MEDICATIONS:  1. Neurontin 400 mg t.i.d.  2. Effexor XR 225 mg daily.  3. Zanaflex 4 mg t.i.d. p.r.n.  4. Oxycodone immediate release 5 mg 4-5 times per day p.r.n.  5. Oxybutynin one-half tablet daily.   REVIEW OF SYSTEMS:  Positive for urinary retention.   PHYSICAL EXAMINATION:  GENERAL:  A well-appearing, moderately overweight  adult female, seated in a manual wheelchair.  VITAL SIGNS:  Blood pressure is 124/53 with a pulse of 93, respiratory  rate 18, and O2 saturation 96% on room air.  MUSCULOSKELETAL:  Hip flexor is 4+/5 bilaterally and ankle dorsiflexion  with 2/5 bilaterally.  Hamstring strength was 2-/5.   IMPRESSION:  1. Status post thoracic epidural abscess with subsequent evacuation      and completion of antibiotics.  2. History of left pleural empyema treated with video-assisted      thoracic surgery on March 01, 2007, with positive lupus      anticoagulant.  3. Anxiety disorder with depression.  4.  Neurogenic bowel and bladder secondary to status post thoracic      epidural abscess with subsequent evacuation and completion of      antibiotics.  5. Neuropathic pain at the bilateral lower extremities distally.   PLAN:  In the office today, we did refill the patient's oxycodone  immediate release 1 tablet 5 times per day p.r.n.  We also gave her a  script for handicapped parking permanent.  We will set her up for Botox  injections in the right posterior calf for hopefully treatment of  spasticity related to toe flexors.  She can continue using the heating  pad for her sensation of her right foot and she is comfortable with  that at the present time.  We will plan on seeing the  patient in followup once the approval has been obtained for the Botox  injections 200 units to the right toe flexors in the right lower  extremity.           ______________________________  Ellwood Dense, M.D.     DC/MedQ  D:  05/05/2008 15:55:17  T:  05/06/2008 04:35:51  Job #:  [id number]

## 2011-05-09 LAB — PROTIME-INR
INR: 2.4 — ABNORMAL HIGH
INR: 2.5 — ABNORMAL HIGH
INR: 2.6 — ABNORMAL HIGH
INR: 1.8 — ABNORMAL HIGH
INR: 1.8 — ABNORMAL HIGH
INR: 1.8 — ABNORMAL HIGH
INR: 2 — ABNORMAL HIGH
INR: 2.6 — ABNORMAL HIGH
INR: 2.7 — ABNORMAL HIGH
INR: 2.8 — ABNORMAL HIGH
Prothrombin Time: 25.2 — ABNORMAL HIGH
Prothrombin Time: 27.8 — ABNORMAL HIGH
Prothrombin Time: 28.4 — ABNORMAL HIGH
Prothrombin Time: 21.3 — ABNORMAL HIGH
Prothrombin Time: 21.5 — ABNORMAL HIGH
Prothrombin Time: 21.7 — ABNORMAL HIGH
Prothrombin Time: 23.3 — ABNORMAL HIGH
Prothrombin Time: 28.7 — ABNORMAL HIGH
Prothrombin Time: 29.6 — ABNORMAL HIGH
Prothrombin Time: 31.2 — ABNORMAL HIGH

## 2011-05-09 LAB — URINE MICROSCOPIC-ADD ON

## 2011-05-09 LAB — URINALYSIS, ROUTINE W REFLEX MICROSCOPIC
Bilirubin Urine: NEGATIVE
Glucose, UA: NEGATIVE
Ketones, ur: NEGATIVE
Ketones, ur: NEGATIVE
Nitrite: NEGATIVE
Nitrite: NEGATIVE
Protein, ur: NEGATIVE
Specific Gravity, Urine: 1.015
Urobilinogen, UA: 0.2
pH: 6.5

## 2011-05-09 LAB — CBC
HCT: 36.5
MCV: 77.1 — ABNORMAL LOW
RBC: 4.73
WBC: 7.2

## 2011-05-09 LAB — URINE CULTURE

## 2011-05-12 LAB — PROTIME-INR
INR: 1.2
INR: 1.6 — ABNORMAL HIGH
INR: 2 — ABNORMAL HIGH
INR: 2.1 — ABNORMAL HIGH
INR: 2.3 — ABNORMAL HIGH
INR: 2.4 — ABNORMAL HIGH
Prothrombin Time: 19.1 — ABNORMAL HIGH
Prothrombin Time: 22.9 — ABNORMAL HIGH
Prothrombin Time: 23.5 — ABNORMAL HIGH
Prothrombin Time: 24.5 — ABNORMAL HIGH
Prothrombin Time: 26.2 — ABNORMAL HIGH

## 2011-05-12 LAB — IRON AND TIBC
Iron: 19 — ABNORMAL LOW
Saturation Ratios: 11 — ABNORMAL LOW
TIBC: 168 — ABNORMAL LOW
UIBC: 149

## 2011-05-12 LAB — COMPREHENSIVE METABOLIC PANEL
Albumin: 1.3 — ABNORMAL LOW
Alkaline Phosphatase: 111
Alkaline Phosphatase: 88
BUN: 3 — ABNORMAL LOW
BUN: 7
Calcium: 7.7 — ABNORMAL LOW
Creatinine, Ser: 0.42
Glucose, Bld: 84
Glucose, Bld: 95
Potassium: 3.3 — ABNORMAL LOW
Potassium: 3.5
Total Protein: 4.1 — ABNORMAL LOW
Total Protein: 5.7 — ABNORMAL LOW

## 2011-05-12 LAB — CBC
HCT: 25.1 — ABNORMAL LOW
HCT: 25.1 — ABNORMAL LOW
HCT: 32.2 — ABNORMAL LOW
HCT: 32.9 — ABNORMAL LOW
HCT: 33.5 — ABNORMAL LOW
HCT: 41.3
Hemoglobin: 10.8 — ABNORMAL LOW
Hemoglobin: 8.4 — ABNORMAL LOW
Hemoglobin: 8.4 — ABNORMAL LOW
Hemoglobin: 8.6 — ABNORMAL LOW
Hemoglobin: 12.9
Hemoglobin: 12.9
Hemoglobin: 13.3
Hemoglobin: 14
MCHC: 32.9
MCHC: 33.5
MCHC: 33.9
MCHC: 33.3
MCHC: 33.8
MCHC: 34
MCHC: 34
MCHC: 34.3
MCV: 83.4
MCV: 84
MCV: 84.3
MCV: 84.8
MCV: 85.8
MCV: 84.1
MCV: 85.1
MCV: 85.1
Platelets: 346
Platelets: 359
Platelets: 363
Platelets: 421 — ABNORMAL HIGH
Platelets: 436 — ABNORMAL HIGH
Platelets: 438 — ABNORMAL HIGH
Platelets: 482 — ABNORMAL HIGH
Platelets: 602 — ABNORMAL HIGH
Platelets: 258
Platelets: 278
RBC: 3.97
RBC: 3.99
RBC: 4.02
RBC: 4.56
RBC: 4.57
RBC: 4.67
RBC: 4.85
RBC: 5.05
RBC: 5.46 — ABNORMAL HIGH
RDW: 13.8
RDW: 13.9
RDW: 13.9
RDW: 14.3 — ABNORMAL HIGH
RDW: 14.4 — ABNORMAL HIGH
RDW: 14.6 — ABNORMAL HIGH
RDW: 14.9 — ABNORMAL HIGH
RDW: 13.2
RDW: 13.4
RDW: 13.4
WBC: 12.3 — ABNORMAL HIGH
WBC: 14.4 — ABNORMAL HIGH
WBC: 16.6 — ABNORMAL HIGH
WBC: 7.1
WBC: 8.6
WBC: 8.6
WBC: 12.2 — ABNORMAL HIGH
WBC: 21.2 — ABNORMAL HIGH
WBC: 27.7 — ABNORMAL HIGH
WBC: 31.1 — ABNORMAL HIGH

## 2011-05-12 LAB — TISSUE CULTURE: Gram Stain: NONE SEEN

## 2011-05-12 LAB — CULTURE, BLOOD (ROUTINE X 2)

## 2011-05-12 LAB — CULTURE, ROUTINE-ABSCESS: Gram Stain: NONE SEEN

## 2011-05-12 LAB — BODY FLUID CULTURE
Culture: NO GROWTH
Gram Stain: NONE SEEN

## 2011-05-12 LAB — BASIC METABOLIC PANEL
BUN: 4 — ABNORMAL LOW
BUN: 4 — ABNORMAL LOW
BUN: 6
BUN: 3 — ABNORMAL LOW
CO2: 27
CO2: 28
CO2: 28
CO2: 29
CO2: 30
Calcium: 8 — ABNORMAL LOW
Calcium: 8.4
Calcium: 8.4
Calcium: 8.5
Calcium: 8.6
Calcium: 8.7
Calcium: 8.9
Chloride: 100
Chloride: 102
Chloride: 91 — ABNORMAL LOW
Chloride: 99
Creatinine, Ser: 0.44
Creatinine, Ser: 0.55
Creatinine, Ser: 0.63
Creatinine, Ser: 0.71
Creatinine, Ser: 0.86
GFR calc Af Amer: >60
GFR calc Af Amer: >60
GFR calc Af Amer: >60
GFR calc Af Amer: >60
GFR calc non Af Amer: >60
GFR calc non Af Amer: >60
GFR calc non Af Amer: >60
GFR calc non Af Amer: >60
GFR calc non Af Amer: >60
GFR calc non Af Amer: >60
Glucose, Bld: 101 — ABNORMAL HIGH
Glucose, Bld: 88
Glucose, Bld: 88
Glucose, Bld: 111 — ABNORMAL HIGH
Glucose, Bld: 135 — ABNORMAL HIGH
Potassium: 3.7
Potassium: 4.1
Potassium: 3.8
Sodium: 132 — ABNORMAL LOW
Sodium: 138
Sodium: 138
Sodium: 129 — ABNORMAL LOW
Sodium: 133 — ABNORMAL LOW
Sodium: 134 — ABNORMAL LOW
Sodium: 135
Sodium: 137
Sodium: 138

## 2011-05-12 LAB — AFB CULTURE WITH SMEAR (NOT AT ARMC)

## 2011-05-12 LAB — URINALYSIS, ROUTINE W REFLEX MICROSCOPIC
Bilirubin Urine: NEGATIVE
Glucose, UA: NEGATIVE
Glucose, UA: NEGATIVE
Hgb urine dipstick: NEGATIVE
Ketones, ur: NEGATIVE
Ketones, ur: NEGATIVE
Leukocytes, UA: NEGATIVE
Nitrite: NEGATIVE
Protein, ur: NEGATIVE
Specific Gravity, Urine: 1.026
Specific Gravity, Urine: 1.036 — ABNORMAL HIGH
pH: 6.5
pH: 6.5
pH: 7

## 2011-05-12 LAB — DIFFERENTIAL
Basophils Absolute: 0.1
Basophils Absolute: 0
Basophils Relative: 1
Basophils Relative: 0
Basophils Relative: 0
Eosinophils Relative: 2
Lymphocytes Relative: 14
Monocytes Relative: 10
Neutro Abs: 3.8
Neutro Abs: 10.7 — ABNORMAL HIGH
Neutrophils Relative %: 49
Neutrophils Relative %: 75
Neutrophils Relative %: 86 — ABNORMAL HIGH

## 2011-05-12 LAB — LACTATE DEHYDROGENASE, PLEURAL OR PERITONEAL FLUID: LD, Fluid: 434 — ABNORMAL HIGH

## 2011-05-12 LAB — FUNGUS CULTURE W SMEAR
Fungal Smear: NONE SEEN
Fungal Smear: NONE SEEN

## 2011-05-12 LAB — BODY FLUID CELL COUNT WITH DIFFERENTIAL
Eos, Fluid: 0
Total Nucleated Cell Count, Fluid: 355

## 2011-05-12 LAB — I-STAT 8, (EC8 V) (CONVERTED LAB)
Bicarbonate: 29.3 — ABNORMAL HIGH
HCT: 48 — ABNORMAL HIGH
Hemoglobin: 16.3 — ABNORMAL HIGH
Operator id: 151321
Sodium: 135
TCO2: 31

## 2011-05-12 LAB — CROSSMATCH
ABO/RH(D): O POS
Antibody Screen: NEGATIVE

## 2011-05-12 LAB — URINE MICROSCOPIC-ADD ON

## 2011-05-12 LAB — ABO/RH: ABO/RH(D): O POS

## 2011-05-12 LAB — URINE CULTURE: Colony Count: 8000

## 2011-05-12 LAB — B-NATRIURETIC PEPTIDE (CONVERTED LAB): Pro B Natriuretic peptide (BNP): 44

## 2011-05-12 LAB — C-REACTIVE PROTEIN: CRP: 43 — ABNORMAL HIGH (ref <0.6)

## 2011-05-12 LAB — PREALBUMIN: Prealbumin: 5.4 — ABNORMAL LOW

## 2011-05-12 LAB — BLOOD GAS, ARTERIAL
O2 Content: 3
pCO2 arterial: 39
pH, Arterial: 7.444 — ABNORMAL HIGH
pO2, Arterial: 77 — ABNORMAL LOW

## 2011-05-12 LAB — POCT I-STAT 3, ART BLOOD GAS (G3+)
Acid-Base Excess: 2
O2 Saturation: 92
TCO2: 27
pCO2 arterial: 37.1
pO2, Arterial: 61 — ABNORMAL LOW

## 2011-05-12 LAB — VANCOMYCIN, TROUGH: Vancomycin Tr: 14.3

## 2011-05-12 LAB — FOLATE: Folate: 8.1

## 2011-05-12 LAB — ANAEROBIC CULTURE: Gram Stain: NONE SEEN

## 2011-05-12 LAB — GLUCOSE, SEROUS FLUID

## 2011-05-12 LAB — RETICULOCYTES
RBC.: 3.06 — ABNORMAL LOW
Retic Count, Absolute: 122.4
Retic Ct Pct: 4 — ABNORMAL HIGH

## 2011-05-12 LAB — GRAM STAIN

## 2011-05-12 LAB — PROTEIN, BODY FLUID: Total protein, fluid: 4.1

## 2011-05-12 LAB — D-DIMER, QUANTITATIVE
D-Dimer, Quant: 3.92 — ABNORMAL HIGH
D-Dimer, Quant: 3.97 — ABNORMAL HIGH

## 2011-05-12 LAB — APTT: aPTT: 30

## 2011-05-12 LAB — FERRITIN: Ferritin: 208 (ref 10–291)

## 2011-05-12 LAB — SEDIMENTATION RATE: Sed Rate: 80 — ABNORMAL HIGH

## 2011-05-12 LAB — SODIUM: Sodium: 131 — ABNORMAL LOW

## 2016-01-07 ENCOUNTER — Other Ambulatory Visit: Payer: Self-pay | Admitting: Family Medicine

## 2016-01-07 ENCOUNTER — Other Ambulatory Visit (HOSPITAL_COMMUNITY)
Admission: RE | Admit: 2016-01-07 | Discharge: 2016-01-07 | Disposition: A | Discharge status: Home / Self Care | Payer: BLUE CROSS/BLUE SHIELD | Source: Ambulatory Visit | Attending: Family Medicine | Admitting: Family Medicine

## 2016-01-07 DIAGNOSIS — Z01419 Encounter for gynecological examination (general) (routine) without abnormal findings: Secondary | ICD-10-CM | POA: Insufficient documentation

## 2016-01-07 DIAGNOSIS — Z1151 Encounter for screening for human papillomavirus (HPV): Secondary | ICD-10-CM | POA: Diagnosis present

## 2016-01-09 LAB — CYTOLOGY - PAP

## 2017-11-13 DIAGNOSIS — J069 Acute upper respiratory infection, unspecified: Secondary | ICD-10-CM | POA: Diagnosis not present

## 2017-11-13 DIAGNOSIS — H6983 Other specified disorders of Eustachian tube, bilateral: Secondary | ICD-10-CM | POA: Diagnosis not present

## 2017-11-13 DIAGNOSIS — J029 Acute pharyngitis, unspecified: Secondary | ICD-10-CM | POA: Diagnosis not present

## 2017-12-16 DIAGNOSIS — S82899D Other fracture of unspecified lower leg, subsequent encounter for closed fracture with routine healing: Secondary | ICD-10-CM | POA: Diagnosis not present

## 2017-12-16 DIAGNOSIS — G894 Chronic pain syndrome: Secondary | ICD-10-CM | POA: Diagnosis not present

## 2018-01-11 DIAGNOSIS — G8220 Paraplegia, unspecified: Secondary | ICD-10-CM | POA: Diagnosis not present

## 2018-01-11 DIAGNOSIS — N3281 Overactive bladder: Secondary | ICD-10-CM | POA: Diagnosis not present

## 2018-01-11 DIAGNOSIS — F321 Major depressive disorder, single episode, moderate: Secondary | ICD-10-CM | POA: Diagnosis not present

## 2018-01-11 DIAGNOSIS — Z0000 Encounter for general adult medical examination without abnormal findings: Secondary | ICD-10-CM | POA: Diagnosis not present

## 2018-01-11 DIAGNOSIS — H3530 Unspecified macular degeneration: Secondary | ICD-10-CM | POA: Diagnosis not present

## 2018-01-11 DIAGNOSIS — Z86711 Personal history of pulmonary embolism: Secondary | ICD-10-CM | POA: Diagnosis not present

## 2018-01-11 DIAGNOSIS — Z79899 Other long term (current) drug therapy: Secondary | ICD-10-CM | POA: Diagnosis not present

## 2018-01-11 DIAGNOSIS — G894 Chronic pain syndrome: Secondary | ICD-10-CM | POA: Diagnosis not present

## 2018-01-11 DIAGNOSIS — Z6841 Body Mass Index (BMI) 40.0 and over, adult: Secondary | ICD-10-CM | POA: Diagnosis not present

## 2018-01-11 DIAGNOSIS — Z136 Encounter for screening for cardiovascular disorders: Secondary | ICD-10-CM | POA: Diagnosis not present

## 2018-01-13 DIAGNOSIS — G4733 Obstructive sleep apnea (adult) (pediatric): Secondary | ICD-10-CM | POA: Diagnosis not present

## 2018-01-19 DIAGNOSIS — J069 Acute upper respiratory infection, unspecified: Secondary | ICD-10-CM | POA: Diagnosis not present

## 2018-01-19 DIAGNOSIS — R05 Cough: Secondary | ICD-10-CM | POA: Diagnosis not present

## 2018-02-28 DIAGNOSIS — G4733 Obstructive sleep apnea (adult) (pediatric): Secondary | ICD-10-CM | POA: Diagnosis not present

## 2018-03-10 DIAGNOSIS — M5416 Radiculopathy, lumbar region: Secondary | ICD-10-CM | POA: Diagnosis not present

## 2018-03-10 DIAGNOSIS — M5441 Lumbago with sciatica, right side: Secondary | ICD-10-CM | POA: Diagnosis not present

## 2018-03-10 DIAGNOSIS — M5442 Lumbago with sciatica, left side: Secondary | ICD-10-CM | POA: Diagnosis not present

## 2018-03-10 DIAGNOSIS — Z885 Allergy status to narcotic agent status: Secondary | ICD-10-CM | POA: Diagnosis not present

## 2018-03-10 DIAGNOSIS — G894 Chronic pain syndrome: Secondary | ICD-10-CM | POA: Diagnosis not present

## 2018-03-10 DIAGNOSIS — M961 Postlaminectomy syndrome, not elsewhere classified: Secondary | ICD-10-CM | POA: Diagnosis not present

## 2018-03-10 DIAGNOSIS — Z880 Allergy status to penicillin: Secondary | ICD-10-CM | POA: Diagnosis not present

## 2018-03-10 DIAGNOSIS — Z5181 Encounter for therapeutic drug level monitoring: Secondary | ICD-10-CM | POA: Diagnosis not present

## 2018-03-12 DIAGNOSIS — N751 Abscess of Bartholin's gland: Secondary | ICD-10-CM | POA: Diagnosis not present

## 2018-03-12 DIAGNOSIS — R300 Dysuria: Secondary | ICD-10-CM | POA: Diagnosis not present

## 2018-03-20 DIAGNOSIS — G8929 Other chronic pain: Secondary | ICD-10-CM | POA: Diagnosis not present

## 2018-03-20 DIAGNOSIS — M5116 Intervertebral disc disorders with radiculopathy, lumbar region: Secondary | ICD-10-CM | POA: Diagnosis not present

## 2018-03-20 DIAGNOSIS — M5114 Intervertebral disc disorders with radiculopathy, thoracic region: Secondary | ICD-10-CM | POA: Diagnosis not present

## 2018-03-20 DIAGNOSIS — M5442 Lumbago with sciatica, left side: Secondary | ICD-10-CM | POA: Diagnosis not present

## 2018-03-20 DIAGNOSIS — M5441 Lumbago with sciatica, right side: Secondary | ICD-10-CM | POA: Diagnosis not present

## 2018-03-31 DIAGNOSIS — Z880 Allergy status to penicillin: Secondary | ICD-10-CM | POA: Diagnosis not present

## 2018-03-31 DIAGNOSIS — Z5181 Encounter for therapeutic drug level monitoring: Secondary | ICD-10-CM | POA: Diagnosis not present

## 2018-03-31 DIAGNOSIS — G894 Chronic pain syndrome: Secondary | ICD-10-CM | POA: Diagnosis not present

## 2018-03-31 DIAGNOSIS — Z885 Allergy status to narcotic agent status: Secondary | ICD-10-CM | POA: Diagnosis not present

## 2018-03-31 DIAGNOSIS — M5416 Radiculopathy, lumbar region: Secondary | ICD-10-CM | POA: Diagnosis not present

## 2018-03-31 DIAGNOSIS — M961 Postlaminectomy syndrome, not elsewhere classified: Secondary | ICD-10-CM | POA: Diagnosis not present

## 2018-03-31 DIAGNOSIS — Z79899 Other long term (current) drug therapy: Secondary | ICD-10-CM | POA: Diagnosis not present

## 2018-03-31 DIAGNOSIS — M5441 Lumbago with sciatica, right side: Secondary | ICD-10-CM | POA: Diagnosis not present

## 2018-03-31 DIAGNOSIS — Z8661 Personal history of infections of the central nervous system: Secondary | ICD-10-CM | POA: Diagnosis not present

## 2018-03-31 DIAGNOSIS — M5442 Lumbago with sciatica, left side: Secondary | ICD-10-CM | POA: Diagnosis not present

## 2018-04-13 DIAGNOSIS — R300 Dysuria: Secondary | ICD-10-CM | POA: Diagnosis not present

## 2018-04-28 DIAGNOSIS — Z79891 Long term (current) use of opiate analgesic: Secondary | ICD-10-CM | POA: Diagnosis not present

## 2018-04-28 DIAGNOSIS — M5442 Lumbago with sciatica, left side: Secondary | ICD-10-CM | POA: Diagnosis not present

## 2018-04-28 DIAGNOSIS — M461 Sacroiliitis, not elsewhere classified: Secondary | ICD-10-CM | POA: Diagnosis not present

## 2018-04-28 DIAGNOSIS — M5441 Lumbago with sciatica, right side: Secondary | ICD-10-CM | POA: Diagnosis not present

## 2018-04-28 DIAGNOSIS — G894 Chronic pain syndrome: Secondary | ICD-10-CM | POA: Diagnosis not present

## 2018-05-11 DIAGNOSIS — M461 Sacroiliitis, not elsewhere classified: Secondary | ICD-10-CM | POA: Diagnosis not present

## 2018-05-11 DIAGNOSIS — M533 Sacrococcygeal disorders, not elsewhere classified: Secondary | ICD-10-CM | POA: Diagnosis not present

## 2018-05-18 DIAGNOSIS — Z23 Encounter for immunization: Secondary | ICD-10-CM | POA: Diagnosis not present

## 2018-05-21 DIAGNOSIS — N390 Urinary tract infection, site not specified: Secondary | ICD-10-CM | POA: Diagnosis not present

## 2018-05-21 DIAGNOSIS — R300 Dysuria: Secondary | ICD-10-CM | POA: Diagnosis not present

## 2018-06-02 DIAGNOSIS — G894 Chronic pain syndrome: Secondary | ICD-10-CM | POA: Diagnosis not present

## 2018-06-02 DIAGNOSIS — G8929 Other chronic pain: Secondary | ICD-10-CM | POA: Diagnosis not present

## 2018-06-02 DIAGNOSIS — M545 Low back pain: Secondary | ICD-10-CM | POA: Diagnosis not present

## 2018-06-02 DIAGNOSIS — Z885 Allergy status to narcotic agent status: Secondary | ICD-10-CM | POA: Diagnosis not present

## 2018-06-02 DIAGNOSIS — Z8659 Personal history of other mental and behavioral disorders: Secondary | ICD-10-CM | POA: Diagnosis not present

## 2018-06-02 DIAGNOSIS — F419 Anxiety disorder, unspecified: Secondary | ICD-10-CM | POA: Diagnosis not present

## 2018-06-02 DIAGNOSIS — Z880 Allergy status to penicillin: Secondary | ICD-10-CM | POA: Diagnosis not present

## 2018-06-02 DIAGNOSIS — Z8661 Personal history of infections of the central nervous system: Secondary | ICD-10-CM | POA: Diagnosis not present

## 2018-06-18 DIAGNOSIS — M545 Low back pain: Secondary | ICD-10-CM | POA: Diagnosis not present

## 2018-06-18 DIAGNOSIS — R1011 Right upper quadrant pain: Secondary | ICD-10-CM | POA: Diagnosis not present

## 2018-06-18 DIAGNOSIS — N139 Obstructive and reflux uropathy, unspecified: Secondary | ICD-10-CM | POA: Diagnosis not present

## 2018-06-18 DIAGNOSIS — N3020 Other chronic cystitis without hematuria: Secondary | ICD-10-CM | POA: Diagnosis not present

## 2018-06-22 DIAGNOSIS — S79911A Unspecified injury of right hip, initial encounter: Secondary | ICD-10-CM | POA: Diagnosis not present

## 2018-06-22 DIAGNOSIS — R102 Pelvic and perineal pain: Secondary | ICD-10-CM | POA: Diagnosis not present

## 2018-06-22 DIAGNOSIS — M25551 Pain in right hip: Secondary | ICD-10-CM | POA: Diagnosis not present

## 2018-06-30 DIAGNOSIS — M961 Postlaminectomy syndrome, not elsewhere classified: Secondary | ICD-10-CM | POA: Diagnosis not present

## 2018-06-30 DIAGNOSIS — M461 Sacroiliitis, not elsewhere classified: Secondary | ICD-10-CM | POA: Diagnosis not present

## 2018-06-30 DIAGNOSIS — G894 Chronic pain syndrome: Secondary | ICD-10-CM | POA: Diagnosis not present

## 2018-06-30 DIAGNOSIS — Z79899 Other long term (current) drug therapy: Secondary | ICD-10-CM | POA: Diagnosis not present

## 2018-06-30 DIAGNOSIS — Z79891 Long term (current) use of opiate analgesic: Secondary | ICD-10-CM | POA: Diagnosis not present

## 2018-06-30 DIAGNOSIS — M5416 Radiculopathy, lumbar region: Secondary | ICD-10-CM | POA: Diagnosis not present

## 2018-06-30 DIAGNOSIS — Z5181 Encounter for therapeutic drug level monitoring: Secondary | ICD-10-CM | POA: Diagnosis not present

## 2018-06-30 DIAGNOSIS — R102 Pelvic and perineal pain: Secondary | ICD-10-CM | POA: Diagnosis not present

## 2018-07-05 DIAGNOSIS — G4733 Obstructive sleep apnea (adult) (pediatric): Secondary | ICD-10-CM | POA: Diagnosis not present

## 2018-07-13 DIAGNOSIS — R1011 Right upper quadrant pain: Secondary | ICD-10-CM | POA: Diagnosis not present

## 2018-07-14 ENCOUNTER — Other Ambulatory Visit: Payer: Self-pay | Admitting: Family Medicine

## 2018-07-14 DIAGNOSIS — R1011 Right upper quadrant pain: Secondary | ICD-10-CM

## 2018-07-19 ENCOUNTER — Ambulatory Visit
Admission: RE | Admit: 2018-07-19 | Discharge: 2018-07-19 | Disposition: A | Discharge status: Home / Self Care | Payer: Medicare Other | Source: Ambulatory Visit | Attending: Family Medicine | Admitting: Family Medicine

## 2018-07-19 ENCOUNTER — Encounter: Payer: Self-pay | Admitting: Radiology

## 2018-07-19 DIAGNOSIS — N368 Other specified disorders of urethra: Secondary | ICD-10-CM | POA: Diagnosis not present

## 2018-07-19 DIAGNOSIS — R1011 Right upper quadrant pain: Secondary | ICD-10-CM | POA: Diagnosis not present

## 2018-07-19 MED ORDER — IOPAMIDOL (ISOVUE-300) INJECTION 61%
125.0000 mL | Freq: Once | INTRAVENOUS | Status: AC | PRN
  Administered 2018-07-19: 125 mL via INTRAVENOUS

## 2018-07-30 ENCOUNTER — Other Ambulatory Visit: Payer: Self-pay | Admitting: Urology

## 2018-07-30 DIAGNOSIS — N952 Postmenopausal atrophic vaginitis: Secondary | ICD-10-CM | POA: Diagnosis not present

## 2018-07-30 DIAGNOSIS — N361 Urethral diverticulum: Secondary | ICD-10-CM | POA: Diagnosis not present

## 2018-07-30 DIAGNOSIS — N3020 Other chronic cystitis without hematuria: Secondary | ICD-10-CM | POA: Diagnosis not present

## 2018-07-30 DIAGNOSIS — N3941 Urge incontinence: Secondary | ICD-10-CM | POA: Diagnosis not present

## 2018-07-30 DIAGNOSIS — R3914 Feeling of incomplete bladder emptying: Secondary | ICD-10-CM | POA: Diagnosis not present

## 2018-08-04 DIAGNOSIS — M5416 Radiculopathy, lumbar region: Secondary | ICD-10-CM | POA: Diagnosis not present

## 2018-08-04 DIAGNOSIS — F329 Major depressive disorder, single episode, unspecified: Secondary | ICD-10-CM | POA: Diagnosis not present

## 2018-08-04 DIAGNOSIS — G0481 Other encephalitis and encephalomyelitis: Secondary | ICD-10-CM | POA: Diagnosis not present

## 2018-08-04 DIAGNOSIS — M5442 Lumbago with sciatica, left side: Secondary | ICD-10-CM | POA: Diagnosis not present

## 2018-08-04 DIAGNOSIS — M5441 Lumbago with sciatica, right side: Secondary | ICD-10-CM | POA: Diagnosis not present

## 2018-08-04 DIAGNOSIS — Z8781 Personal history of (healed) traumatic fracture: Secondary | ICD-10-CM | POA: Diagnosis not present

## 2018-08-04 DIAGNOSIS — Z5181 Encounter for therapeutic drug level monitoring: Secondary | ICD-10-CM | POA: Diagnosis not present

## 2018-08-04 DIAGNOSIS — G894 Chronic pain syndrome: Secondary | ICD-10-CM | POA: Diagnosis not present

## 2018-08-04 DIAGNOSIS — Z9889 Other specified postprocedural states: Secondary | ICD-10-CM | POA: Diagnosis not present

## 2018-08-04 DIAGNOSIS — M461 Sacroiliitis, not elsewhere classified: Secondary | ICD-10-CM | POA: Diagnosis not present

## 2018-08-04 DIAGNOSIS — Z79891 Long term (current) use of opiate analgesic: Secondary | ICD-10-CM | POA: Diagnosis not present

## 2018-08-04 DIAGNOSIS — Z79899 Other long term (current) drug therapy: Secondary | ICD-10-CM | POA: Diagnosis not present

## 2018-08-04 DIAGNOSIS — M961 Postlaminectomy syndrome, not elsewhere classified: Secondary | ICD-10-CM | POA: Diagnosis not present

## 2018-08-09 ENCOUNTER — Other Ambulatory Visit: Payer: Medicare Other

## 2018-08-09 ENCOUNTER — Ambulatory Visit
Admission: RE | Admit: 2018-08-09 | Discharge: 2018-08-09 | Disposition: A | Discharge status: Home / Self Care | Payer: Medicare Other | Source: Ambulatory Visit | Attending: Urology | Admitting: Urology

## 2018-08-09 DIAGNOSIS — N361 Urethral diverticulum: Secondary | ICD-10-CM | POA: Diagnosis not present

## 2018-08-09 MED ORDER — GADOBENATE DIMEGLUMINE 529 MG/ML IV SOLN
20.0000 mL | Freq: Once | INTRAVENOUS | Status: AC | PRN
  Administered 2018-08-09: 20 mL via INTRAVENOUS

## 2018-08-18 DIAGNOSIS — N361 Urethral diverticulum: Secondary | ICD-10-CM | POA: Diagnosis not present

## 2018-08-18 DIAGNOSIS — N3020 Other chronic cystitis without hematuria: Secondary | ICD-10-CM | POA: Diagnosis not present

## 2018-08-24 DIAGNOSIS — G8220 Paraplegia, unspecified: Secondary | ICD-10-CM | POA: Diagnosis not present

## 2018-08-24 DIAGNOSIS — M5416 Radiculopathy, lumbar region: Secondary | ICD-10-CM | POA: Diagnosis not present

## 2018-09-01 DIAGNOSIS — Z79899 Other long term (current) drug therapy: Secondary | ICD-10-CM | POA: Diagnosis not present

## 2018-09-01 DIAGNOSIS — M5416 Radiculopathy, lumbar region: Secondary | ICD-10-CM | POA: Diagnosis not present

## 2018-09-01 DIAGNOSIS — M961 Postlaminectomy syndrome, not elsewhere classified: Secondary | ICD-10-CM | POA: Diagnosis not present

## 2018-09-01 DIAGNOSIS — Z5181 Encounter for therapeutic drug level monitoring: Secondary | ICD-10-CM | POA: Diagnosis not present

## 2018-09-01 DIAGNOSIS — M461 Sacroiliitis, not elsewhere classified: Secondary | ICD-10-CM | POA: Diagnosis not present

## 2018-09-01 DIAGNOSIS — G894 Chronic pain syndrome: Secondary | ICD-10-CM | POA: Diagnosis not present

## 2018-09-01 DIAGNOSIS — R102 Pelvic and perineal pain: Secondary | ICD-10-CM | POA: Diagnosis not present

## 2018-09-10 DIAGNOSIS — Z7409 Other reduced mobility: Secondary | ICD-10-CM | POA: Diagnosis not present

## 2018-09-10 DIAGNOSIS — G894 Chronic pain syndrome: Secondary | ICD-10-CM | POA: Diagnosis not present

## 2018-09-10 DIAGNOSIS — R102 Pelvic and perineal pain: Secondary | ICD-10-CM | POA: Diagnosis not present

## 2018-09-10 DIAGNOSIS — Z9889 Other specified postprocedural states: Secondary | ICD-10-CM | POA: Diagnosis not present

## 2018-09-10 DIAGNOSIS — M961 Postlaminectomy syndrome, not elsewhere classified: Secondary | ICD-10-CM | POA: Diagnosis not present

## 2018-09-14 DIAGNOSIS — R102 Pelvic and perineal pain: Secondary | ICD-10-CM | POA: Diagnosis not present

## 2018-09-14 DIAGNOSIS — G894 Chronic pain syndrome: Secondary | ICD-10-CM | POA: Diagnosis not present

## 2018-09-14 DIAGNOSIS — Z9889 Other specified postprocedural states: Secondary | ICD-10-CM | POA: Diagnosis not present

## 2018-09-14 DIAGNOSIS — M961 Postlaminectomy syndrome, not elsewhere classified: Secondary | ICD-10-CM | POA: Diagnosis not present

## 2018-09-14 DIAGNOSIS — Z7409 Other reduced mobility: Secondary | ICD-10-CM | POA: Diagnosis not present

## 2018-09-29 DIAGNOSIS — N3000 Acute cystitis without hematuria: Secondary | ICD-10-CM | POA: Diagnosis not present

## 2018-09-29 DIAGNOSIS — N319 Neuromuscular dysfunction of bladder, unspecified: Secondary | ICD-10-CM | POA: Diagnosis not present

## 2018-09-29 DIAGNOSIS — N3020 Other chronic cystitis without hematuria: Secondary | ICD-10-CM | POA: Diagnosis not present

## 2018-10-07 DIAGNOSIS — R102 Pelvic and perineal pain: Secondary | ICD-10-CM | POA: Diagnosis not present

## 2018-10-07 DIAGNOSIS — Z9889 Other specified postprocedural states: Secondary | ICD-10-CM | POA: Diagnosis not present

## 2018-10-07 DIAGNOSIS — Z7409 Other reduced mobility: Secondary | ICD-10-CM | POA: Diagnosis not present

## 2018-10-07 DIAGNOSIS — G894 Chronic pain syndrome: Secondary | ICD-10-CM | POA: Diagnosis not present

## 2018-10-07 DIAGNOSIS — M961 Postlaminectomy syndrome, not elsewhere classified: Secondary | ICD-10-CM | POA: Diagnosis not present

## 2018-10-12 DIAGNOSIS — M461 Sacroiliitis, not elsewhere classified: Secondary | ICD-10-CM | POA: Diagnosis not present

## 2018-10-12 DIAGNOSIS — Z5181 Encounter for therapeutic drug level monitoring: Secondary | ICD-10-CM | POA: Diagnosis not present

## 2018-10-12 DIAGNOSIS — R102 Pelvic and perineal pain: Secondary | ICD-10-CM | POA: Diagnosis not present

## 2018-10-12 DIAGNOSIS — G894 Chronic pain syndrome: Secondary | ICD-10-CM | POA: Diagnosis not present

## 2018-10-12 DIAGNOSIS — M961 Postlaminectomy syndrome, not elsewhere classified: Secondary | ICD-10-CM | POA: Diagnosis not present

## 2018-10-12 DIAGNOSIS — M533 Sacrococcygeal disorders, not elsewhere classified: Secondary | ICD-10-CM | POA: Diagnosis not present

## 2018-10-12 DIAGNOSIS — M545 Low back pain: Secondary | ICD-10-CM | POA: Diagnosis not present

## 2018-10-13 DIAGNOSIS — Z7409 Other reduced mobility: Secondary | ICD-10-CM | POA: Diagnosis not present

## 2018-10-13 DIAGNOSIS — M961 Postlaminectomy syndrome, not elsewhere classified: Secondary | ICD-10-CM | POA: Diagnosis not present

## 2018-10-13 DIAGNOSIS — R102 Pelvic and perineal pain: Secondary | ICD-10-CM | POA: Diagnosis not present

## 2018-10-13 DIAGNOSIS — Z9889 Other specified postprocedural states: Secondary | ICD-10-CM | POA: Diagnosis not present

## 2018-10-13 DIAGNOSIS — G894 Chronic pain syndrome: Secondary | ICD-10-CM | POA: Diagnosis not present

## 2018-10-18 DIAGNOSIS — J0100 Acute maxillary sinusitis, unspecified: Secondary | ICD-10-CM | POA: Diagnosis not present

## 2018-10-18 DIAGNOSIS — H1032 Unspecified acute conjunctivitis, left eye: Secondary | ICD-10-CM | POA: Diagnosis not present

## 2018-11-01 DIAGNOSIS — N319 Neuromuscular dysfunction of bladder, unspecified: Secondary | ICD-10-CM | POA: Diagnosis not present

## 2018-11-01 DIAGNOSIS — N3020 Other chronic cystitis without hematuria: Secondary | ICD-10-CM | POA: Diagnosis not present

## 2018-11-01 DIAGNOSIS — N390 Urinary tract infection, site not specified: Secondary | ICD-10-CM | POA: Diagnosis not present

## 2018-11-16 DIAGNOSIS — M5442 Lumbago with sciatica, left side: Secondary | ICD-10-CM | POA: Diagnosis not present

## 2018-11-16 DIAGNOSIS — G894 Chronic pain syndrome: Secondary | ICD-10-CM | POA: Diagnosis not present

## 2018-11-16 DIAGNOSIS — Z5181 Encounter for therapeutic drug level monitoring: Secondary | ICD-10-CM | POA: Diagnosis not present

## 2018-11-16 DIAGNOSIS — Z79891 Long term (current) use of opiate analgesic: Secondary | ICD-10-CM | POA: Diagnosis not present

## 2018-11-16 DIAGNOSIS — M961 Postlaminectomy syndrome, not elsewhere classified: Secondary | ICD-10-CM | POA: Diagnosis not present

## 2018-11-16 DIAGNOSIS — M461 Sacroiliitis, not elsewhere classified: Secondary | ICD-10-CM | POA: Diagnosis not present

## 2018-11-16 DIAGNOSIS — Z79899 Other long term (current) drug therapy: Secondary | ICD-10-CM | POA: Diagnosis not present

## 2018-11-16 DIAGNOSIS — R102 Pelvic and perineal pain: Secondary | ICD-10-CM | POA: Diagnosis not present

## 2018-11-16 DIAGNOSIS — M533 Sacrococcygeal disorders, not elsewhere classified: Secondary | ICD-10-CM | POA: Diagnosis not present

## 2018-11-16 DIAGNOSIS — M5441 Lumbago with sciatica, right side: Secondary | ICD-10-CM | POA: Diagnosis not present

## 2018-11-30 DIAGNOSIS — M461 Sacroiliitis, not elsewhere classified: Secondary | ICD-10-CM | POA: Diagnosis not present

## 2018-12-15 DIAGNOSIS — M5412 Radiculopathy, cervical region: Secondary | ICD-10-CM | POA: Diagnosis not present

## 2018-12-15 DIAGNOSIS — M9903 Segmental and somatic dysfunction of lumbar region: Secondary | ICD-10-CM | POA: Diagnosis not present

## 2018-12-15 DIAGNOSIS — S43422A Sprain of left rotator cuff capsule, initial encounter: Secondary | ICD-10-CM | POA: Diagnosis not present

## 2018-12-15 DIAGNOSIS — M9901 Segmental and somatic dysfunction of cervical region: Secondary | ICD-10-CM | POA: Diagnosis not present

## 2018-12-15 DIAGNOSIS — M5441 Lumbago with sciatica, right side: Secondary | ICD-10-CM | POA: Diagnosis not present

## 2018-12-15 DIAGNOSIS — M9907 Segmental and somatic dysfunction of upper extremity: Secondary | ICD-10-CM | POA: Diagnosis not present

## 2018-12-21 DIAGNOSIS — M9901 Segmental and somatic dysfunction of cervical region: Secondary | ICD-10-CM | POA: Diagnosis not present

## 2018-12-21 DIAGNOSIS — M9903 Segmental and somatic dysfunction of lumbar region: Secondary | ICD-10-CM | POA: Diagnosis not present

## 2018-12-21 DIAGNOSIS — M5441 Lumbago with sciatica, right side: Secondary | ICD-10-CM | POA: Diagnosis not present

## 2018-12-21 DIAGNOSIS — S43422A Sprain of left rotator cuff capsule, initial encounter: Secondary | ICD-10-CM | POA: Diagnosis not present

## 2018-12-21 DIAGNOSIS — M5412 Radiculopathy, cervical region: Secondary | ICD-10-CM | POA: Diagnosis not present

## 2018-12-21 DIAGNOSIS — M9907 Segmental and somatic dysfunction of upper extremity: Secondary | ICD-10-CM | POA: Diagnosis not present

## 2018-12-27 DIAGNOSIS — M9901 Segmental and somatic dysfunction of cervical region: Secondary | ICD-10-CM | POA: Diagnosis not present

## 2018-12-27 DIAGNOSIS — M9907 Segmental and somatic dysfunction of upper extremity: Secondary | ICD-10-CM | POA: Diagnosis not present

## 2018-12-27 DIAGNOSIS — M5441 Lumbago with sciatica, right side: Secondary | ICD-10-CM | POA: Diagnosis not present

## 2018-12-27 DIAGNOSIS — S43422A Sprain of left rotator cuff capsule, initial encounter: Secondary | ICD-10-CM | POA: Diagnosis not present

## 2018-12-27 DIAGNOSIS — M9903 Segmental and somatic dysfunction of lumbar region: Secondary | ICD-10-CM | POA: Diagnosis not present

## 2018-12-27 DIAGNOSIS — M5412 Radiculopathy, cervical region: Secondary | ICD-10-CM | POA: Diagnosis not present

## 2018-12-29 DIAGNOSIS — M5442 Lumbago with sciatica, left side: Secondary | ICD-10-CM | POA: Diagnosis not present

## 2018-12-29 DIAGNOSIS — R102 Pelvic and perineal pain: Secondary | ICD-10-CM | POA: Diagnosis not present

## 2018-12-29 DIAGNOSIS — R635 Abnormal weight gain: Secondary | ICD-10-CM | POA: Diagnosis not present

## 2018-12-29 DIAGNOSIS — Z6841 Body Mass Index (BMI) 40.0 and over, adult: Secondary | ICD-10-CM | POA: Diagnosis not present

## 2018-12-29 DIAGNOSIS — M961 Postlaminectomy syndrome, not elsewhere classified: Secondary | ICD-10-CM | POA: Diagnosis not present

## 2018-12-29 DIAGNOSIS — Z79899 Other long term (current) drug therapy: Secondary | ICD-10-CM | POA: Diagnosis not present

## 2018-12-29 DIAGNOSIS — M5441 Lumbago with sciatica, right side: Secondary | ICD-10-CM | POA: Diagnosis not present

## 2018-12-29 DIAGNOSIS — G894 Chronic pain syndrome: Secondary | ICD-10-CM | POA: Diagnosis not present

## 2018-12-29 DIAGNOSIS — M533 Sacrococcygeal disorders, not elsewhere classified: Secondary | ICD-10-CM | POA: Diagnosis not present

## 2019-01-03 DIAGNOSIS — M9901 Segmental and somatic dysfunction of cervical region: Secondary | ICD-10-CM | POA: Diagnosis not present

## 2019-01-03 DIAGNOSIS — M5441 Lumbago with sciatica, right side: Secondary | ICD-10-CM | POA: Diagnosis not present

## 2019-01-03 DIAGNOSIS — M9903 Segmental and somatic dysfunction of lumbar region: Secondary | ICD-10-CM | POA: Diagnosis not present

## 2019-01-03 DIAGNOSIS — S43422A Sprain of left rotator cuff capsule, initial encounter: Secondary | ICD-10-CM | POA: Diagnosis not present

## 2019-01-03 DIAGNOSIS — M5412 Radiculopathy, cervical region: Secondary | ICD-10-CM | POA: Diagnosis not present

## 2019-01-03 DIAGNOSIS — M9907 Segmental and somatic dysfunction of upper extremity: Secondary | ICD-10-CM | POA: Diagnosis not present

## 2019-01-05 DIAGNOSIS — M9901 Segmental and somatic dysfunction of cervical region: Secondary | ICD-10-CM | POA: Diagnosis not present

## 2019-01-05 DIAGNOSIS — S43422A Sprain of left rotator cuff capsule, initial encounter: Secondary | ICD-10-CM | POA: Diagnosis not present

## 2019-01-05 DIAGNOSIS — M9907 Segmental and somatic dysfunction of upper extremity: Secondary | ICD-10-CM | POA: Diagnosis not present

## 2019-01-05 DIAGNOSIS — M5412 Radiculopathy, cervical region: Secondary | ICD-10-CM | POA: Diagnosis not present

## 2019-01-05 DIAGNOSIS — M9903 Segmental and somatic dysfunction of lumbar region: Secondary | ICD-10-CM | POA: Diagnosis not present

## 2019-01-05 DIAGNOSIS — M5441 Lumbago with sciatica, right side: Secondary | ICD-10-CM | POA: Diagnosis not present

## 2019-01-10 DIAGNOSIS — M9901 Segmental and somatic dysfunction of cervical region: Secondary | ICD-10-CM | POA: Diagnosis not present

## 2019-01-10 DIAGNOSIS — M5412 Radiculopathy, cervical region: Secondary | ICD-10-CM | POA: Diagnosis not present

## 2019-01-10 DIAGNOSIS — M5441 Lumbago with sciatica, right side: Secondary | ICD-10-CM | POA: Diagnosis not present

## 2019-01-10 DIAGNOSIS — M9903 Segmental and somatic dysfunction of lumbar region: Secondary | ICD-10-CM | POA: Diagnosis not present

## 2019-01-10 DIAGNOSIS — M9907 Segmental and somatic dysfunction of upper extremity: Secondary | ICD-10-CM | POA: Diagnosis not present

## 2019-01-10 DIAGNOSIS — S43422A Sprain of left rotator cuff capsule, initial encounter: Secondary | ICD-10-CM | POA: Diagnosis not present

## 2019-01-17 DIAGNOSIS — M5441 Lumbago with sciatica, right side: Secondary | ICD-10-CM | POA: Diagnosis not present

## 2019-01-17 DIAGNOSIS — M5412 Radiculopathy, cervical region: Secondary | ICD-10-CM | POA: Diagnosis not present

## 2019-01-17 DIAGNOSIS — M9901 Segmental and somatic dysfunction of cervical region: Secondary | ICD-10-CM | POA: Diagnosis not present

## 2019-01-17 DIAGNOSIS — S43422A Sprain of left rotator cuff capsule, initial encounter: Secondary | ICD-10-CM | POA: Diagnosis not present

## 2019-01-17 DIAGNOSIS — M9903 Segmental and somatic dysfunction of lumbar region: Secondary | ICD-10-CM | POA: Diagnosis not present

## 2019-01-17 DIAGNOSIS — M9907 Segmental and somatic dysfunction of upper extremity: Secondary | ICD-10-CM | POA: Diagnosis not present

## 2019-01-25 DIAGNOSIS — S43422A Sprain of left rotator cuff capsule, initial encounter: Secondary | ICD-10-CM | POA: Diagnosis not present

## 2019-01-25 DIAGNOSIS — M9901 Segmental and somatic dysfunction of cervical region: Secondary | ICD-10-CM | POA: Diagnosis not present

## 2019-01-25 DIAGNOSIS — M5412 Radiculopathy, cervical region: Secondary | ICD-10-CM | POA: Diagnosis not present

## 2019-01-25 DIAGNOSIS — M5441 Lumbago with sciatica, right side: Secondary | ICD-10-CM | POA: Diagnosis not present

## 2019-01-25 DIAGNOSIS — M9903 Segmental and somatic dysfunction of lumbar region: Secondary | ICD-10-CM | POA: Diagnosis not present

## 2019-01-25 DIAGNOSIS — M9907 Segmental and somatic dysfunction of upper extremity: Secondary | ICD-10-CM | POA: Diagnosis not present

## 2019-01-27 DIAGNOSIS — N3000 Acute cystitis without hematuria: Secondary | ICD-10-CM | POA: Diagnosis not present

## 2019-01-27 DIAGNOSIS — N319 Neuromuscular dysfunction of bladder, unspecified: Secondary | ICD-10-CM | POA: Diagnosis not present

## 2019-01-31 DIAGNOSIS — Z86711 Personal history of pulmonary embolism: Secondary | ICD-10-CM | POA: Diagnosis not present

## 2019-01-31 DIAGNOSIS — N361 Urethral diverticulum: Secondary | ICD-10-CM | POA: Diagnosis not present

## 2019-01-31 DIAGNOSIS — N3281 Overactive bladder: Secondary | ICD-10-CM | POA: Diagnosis not present

## 2019-01-31 DIAGNOSIS — G894 Chronic pain syndrome: Secondary | ICD-10-CM | POA: Diagnosis not present

## 2019-01-31 DIAGNOSIS — Z0000 Encounter for general adult medical examination without abnormal findings: Secondary | ICD-10-CM | POA: Diagnosis not present

## 2019-01-31 DIAGNOSIS — F321 Major depressive disorder, single episode, moderate: Secondary | ICD-10-CM | POA: Diagnosis not present

## 2019-02-15 DIAGNOSIS — M47816 Spondylosis without myelopathy or radiculopathy, lumbar region: Secondary | ICD-10-CM | POA: Diagnosis not present

## 2019-02-22 DIAGNOSIS — Z79899 Other long term (current) drug therapy: Secondary | ICD-10-CM | POA: Diagnosis not present

## 2019-02-22 DIAGNOSIS — Z9889 Other specified postprocedural states: Secondary | ICD-10-CM | POA: Diagnosis not present

## 2019-02-22 DIAGNOSIS — M461 Sacroiliitis, not elsewhere classified: Secondary | ICD-10-CM | POA: Diagnosis not present

## 2019-02-22 DIAGNOSIS — Z79891 Long term (current) use of opiate analgesic: Secondary | ICD-10-CM | POA: Diagnosis not present

## 2019-02-22 DIAGNOSIS — G894 Chronic pain syndrome: Secondary | ICD-10-CM | POA: Diagnosis not present

## 2019-02-22 DIAGNOSIS — M5441 Lumbago with sciatica, right side: Secondary | ICD-10-CM | POA: Diagnosis not present

## 2019-02-22 DIAGNOSIS — M47816 Spondylosis without myelopathy or radiculopathy, lumbar region: Secondary | ICD-10-CM | POA: Diagnosis not present

## 2019-02-22 DIAGNOSIS — Z5181 Encounter for therapeutic drug level monitoring: Secondary | ICD-10-CM | POA: Diagnosis not present

## 2019-02-22 DIAGNOSIS — M961 Postlaminectomy syndrome, not elsewhere classified: Secondary | ICD-10-CM | POA: Diagnosis not present

## 2019-02-22 DIAGNOSIS — R102 Pelvic and perineal pain: Secondary | ICD-10-CM | POA: Diagnosis not present

## 2019-02-22 DIAGNOSIS — M533 Sacrococcygeal disorders, not elsewhere classified: Secondary | ICD-10-CM | POA: Diagnosis not present

## 2019-02-22 DIAGNOSIS — M5442 Lumbago with sciatica, left side: Secondary | ICD-10-CM | POA: Diagnosis not present

## 2019-03-07 DIAGNOSIS — M9901 Segmental and somatic dysfunction of cervical region: Secondary | ICD-10-CM | POA: Diagnosis not present

## 2019-03-07 DIAGNOSIS — M9903 Segmental and somatic dysfunction of lumbar region: Secondary | ICD-10-CM | POA: Diagnosis not present

## 2019-03-07 DIAGNOSIS — M9907 Segmental and somatic dysfunction of upper extremity: Secondary | ICD-10-CM | POA: Diagnosis not present

## 2019-03-07 DIAGNOSIS — S43422A Sprain of left rotator cuff capsule, initial encounter: Secondary | ICD-10-CM | POA: Diagnosis not present

## 2019-03-07 DIAGNOSIS — M5441 Lumbago with sciatica, right side: Secondary | ICD-10-CM | POA: Diagnosis not present

## 2019-03-07 DIAGNOSIS — M5412 Radiculopathy, cervical region: Secondary | ICD-10-CM | POA: Diagnosis not present

## 2019-03-08 DIAGNOSIS — M461 Sacroiliitis, not elsewhere classified: Secondary | ICD-10-CM | POA: Diagnosis not present

## 2019-03-15 DIAGNOSIS — R1084 Generalized abdominal pain: Secondary | ICD-10-CM | POA: Diagnosis not present

## 2019-03-15 DIAGNOSIS — N139 Obstructive and reflux uropathy, unspecified: Secondary | ICD-10-CM | POA: Diagnosis not present

## 2019-03-15 DIAGNOSIS — N3020 Other chronic cystitis without hematuria: Secondary | ICD-10-CM | POA: Diagnosis not present

## 2019-03-16 DIAGNOSIS — Z79891 Long term (current) use of opiate analgesic: Secondary | ICD-10-CM | POA: Diagnosis not present

## 2019-03-16 DIAGNOSIS — G8929 Other chronic pain: Secondary | ICD-10-CM | POA: Diagnosis not present

## 2019-03-16 DIAGNOSIS — Z5181 Encounter for therapeutic drug level monitoring: Secondary | ICD-10-CM | POA: Diagnosis not present

## 2019-03-16 DIAGNOSIS — M47816 Spondylosis without myelopathy or radiculopathy, lumbar region: Secondary | ICD-10-CM | POA: Diagnosis not present

## 2019-03-16 DIAGNOSIS — M2550 Pain in unspecified joint: Secondary | ICD-10-CM | POA: Diagnosis not present

## 2019-03-16 DIAGNOSIS — M5442 Lumbago with sciatica, left side: Secondary | ICD-10-CM | POA: Diagnosis not present

## 2019-03-16 DIAGNOSIS — R102 Pelvic and perineal pain: Secondary | ICD-10-CM | POA: Diagnosis not present

## 2019-03-16 DIAGNOSIS — M961 Postlaminectomy syndrome, not elsewhere classified: Secondary | ICD-10-CM | POA: Diagnosis not present

## 2019-03-16 DIAGNOSIS — M5441 Lumbago with sciatica, right side: Secondary | ICD-10-CM | POA: Diagnosis not present

## 2019-03-16 DIAGNOSIS — M461 Sacroiliitis, not elsewhere classified: Secondary | ICD-10-CM | POA: Diagnosis not present

## 2019-04-12 DIAGNOSIS — M5416 Radiculopathy, lumbar region: Secondary | ICD-10-CM | POA: Diagnosis not present

## 2019-04-12 DIAGNOSIS — R102 Pelvic and perineal pain: Secondary | ICD-10-CM | POA: Diagnosis not present

## 2019-04-12 DIAGNOSIS — M7918 Myalgia, other site: Secondary | ICD-10-CM | POA: Diagnosis not present

## 2019-04-12 DIAGNOSIS — M961 Postlaminectomy syndrome, not elsewhere classified: Secondary | ICD-10-CM | POA: Diagnosis not present

## 2019-04-12 DIAGNOSIS — M47816 Spondylosis without myelopathy or radiculopathy, lumbar region: Secondary | ICD-10-CM | POA: Diagnosis not present

## 2019-04-12 DIAGNOSIS — Z5181 Encounter for therapeutic drug level monitoring: Secondary | ICD-10-CM | POA: Diagnosis not present

## 2019-04-12 DIAGNOSIS — G894 Chronic pain syndrome: Secondary | ICD-10-CM | POA: Diagnosis not present

## 2019-04-12 DIAGNOSIS — M461 Sacroiliitis, not elsewhere classified: Secondary | ICD-10-CM | POA: Diagnosis not present

## 2019-04-14 DIAGNOSIS — Z23 Encounter for immunization: Secondary | ICD-10-CM | POA: Diagnosis not present

## 2019-04-20 DIAGNOSIS — M8929 Other disorders of bone development and growth, multiple sites: Secondary | ICD-10-CM | POA: Diagnosis not present

## 2019-04-20 DIAGNOSIS — G8929 Other chronic pain: Secondary | ICD-10-CM | POA: Diagnosis not present

## 2019-04-20 DIAGNOSIS — M7918 Myalgia, other site: Secondary | ICD-10-CM | POA: Diagnosis not present

## 2019-04-20 DIAGNOSIS — M47816 Spondylosis without myelopathy or radiculopathy, lumbar region: Secondary | ICD-10-CM | POA: Diagnosis not present

## 2019-05-10 DIAGNOSIS — Z79899 Other long term (current) drug therapy: Secondary | ICD-10-CM | POA: Diagnosis not present

## 2019-05-10 DIAGNOSIS — G894 Chronic pain syndrome: Secondary | ICD-10-CM | POA: Diagnosis not present

## 2019-05-10 DIAGNOSIS — M533 Sacrococcygeal disorders, not elsewhere classified: Secondary | ICD-10-CM | POA: Diagnosis not present

## 2019-05-10 DIAGNOSIS — M961 Postlaminectomy syndrome, not elsewhere classified: Secondary | ICD-10-CM | POA: Diagnosis not present

## 2019-05-10 DIAGNOSIS — Z5181 Encounter for therapeutic drug level monitoring: Secondary | ICD-10-CM | POA: Diagnosis not present

## 2019-05-10 DIAGNOSIS — M461 Sacroiliitis, not elsewhere classified: Secondary | ICD-10-CM | POA: Diagnosis not present

## 2019-05-10 DIAGNOSIS — M5441 Lumbago with sciatica, right side: Secondary | ICD-10-CM | POA: Diagnosis not present

## 2019-05-10 DIAGNOSIS — M47816 Spondylosis without myelopathy or radiculopathy, lumbar region: Secondary | ICD-10-CM | POA: Diagnosis not present

## 2019-05-10 DIAGNOSIS — M5442 Lumbago with sciatica, left side: Secondary | ICD-10-CM | POA: Diagnosis not present

## 2019-05-10 DIAGNOSIS — M7918 Myalgia, other site: Secondary | ICD-10-CM | POA: Diagnosis not present

## 2019-05-11 DIAGNOSIS — G8929 Other chronic pain: Secondary | ICD-10-CM | POA: Diagnosis not present

## 2019-05-11 DIAGNOSIS — M47816 Spondylosis without myelopathy or radiculopathy, lumbar region: Secondary | ICD-10-CM | POA: Diagnosis not present

## 2019-05-11 DIAGNOSIS — M7918 Myalgia, other site: Secondary | ICD-10-CM | POA: Diagnosis not present

## 2019-05-17 DIAGNOSIS — N390 Urinary tract infection, site not specified: Secondary | ICD-10-CM | POA: Diagnosis not present

## 2019-05-17 DIAGNOSIS — N361 Urethral diverticulum: Secondary | ICD-10-CM | POA: Diagnosis not present

## 2019-05-17 DIAGNOSIS — N3281 Overactive bladder: Secondary | ICD-10-CM | POA: Diagnosis not present

## 2019-05-25 DIAGNOSIS — G8929 Other chronic pain: Secondary | ICD-10-CM | POA: Diagnosis not present

## 2019-05-25 DIAGNOSIS — M533 Sacrococcygeal disorders, not elsewhere classified: Secondary | ICD-10-CM | POA: Diagnosis not present

## 2019-05-25 DIAGNOSIS — M7918 Myalgia, other site: Secondary | ICD-10-CM | POA: Diagnosis not present

## 2019-05-25 DIAGNOSIS — M47816 Spondylosis without myelopathy or radiculopathy, lumbar region: Secondary | ICD-10-CM | POA: Diagnosis not present

## 2019-06-14 DIAGNOSIS — M961 Postlaminectomy syndrome, not elsewhere classified: Secondary | ICD-10-CM | POA: Diagnosis not present

## 2019-06-14 DIAGNOSIS — M5441 Lumbago with sciatica, right side: Secondary | ICD-10-CM | POA: Diagnosis not present

## 2019-06-14 DIAGNOSIS — M7918 Myalgia, other site: Secondary | ICD-10-CM | POA: Diagnosis not present

## 2019-06-14 DIAGNOSIS — M47816 Spondylosis without myelopathy or radiculopathy, lumbar region: Secondary | ICD-10-CM | POA: Diagnosis not present

## 2019-06-14 DIAGNOSIS — M5442 Lumbago with sciatica, left side: Secondary | ICD-10-CM | POA: Diagnosis not present

## 2019-06-14 DIAGNOSIS — R102 Pelvic and perineal pain: Secondary | ICD-10-CM | POA: Diagnosis not present

## 2019-06-14 DIAGNOSIS — G894 Chronic pain syndrome: Secondary | ICD-10-CM | POA: Diagnosis not present

## 2019-06-14 DIAGNOSIS — Z79899 Other long term (current) drug therapy: Secondary | ICD-10-CM | POA: Diagnosis not present

## 2019-06-16 DIAGNOSIS — Z7189 Other specified counseling: Secondary | ICD-10-CM | POA: Diagnosis not present

## 2019-06-16 DIAGNOSIS — Z20828 Contact with and (suspected) exposure to other viral communicable diseases: Secondary | ICD-10-CM | POA: Diagnosis not present

## 2019-06-18 DIAGNOSIS — Z03818 Encounter for observation for suspected exposure to other biological agents ruled out: Secondary | ICD-10-CM | POA: Diagnosis not present

## 2019-07-01 ENCOUNTER — Other Ambulatory Visit (HOSPITAL_COMMUNITY): Payer: Self-pay | Admitting: Family Medicine

## 2019-07-01 ENCOUNTER — Other Ambulatory Visit: Payer: Self-pay

## 2019-07-01 ENCOUNTER — Ambulatory Visit (HOSPITAL_COMMUNITY)
Admission: RE | Admit: 2019-07-01 | Discharge: 2019-07-01 | Disposition: A | Discharge status: Home / Self Care | Payer: Medicare Other | Source: Ambulatory Visit | Attending: Cardiovascular Disease | Admitting: Cardiovascular Disease

## 2019-07-01 DIAGNOSIS — R52 Pain, unspecified: Secondary | ICD-10-CM

## 2019-07-01 DIAGNOSIS — M79604 Pain in right leg: Secondary | ICD-10-CM | POA: Diagnosis not present

## 2019-07-01 DIAGNOSIS — Z6841 Body Mass Index (BMI) 40.0 and over, adult: Secondary | ICD-10-CM | POA: Diagnosis not present

## 2019-07-01 DIAGNOSIS — M79661 Pain in right lower leg: Secondary | ICD-10-CM | POA: Diagnosis not present

## 2019-07-06 DIAGNOSIS — G4733 Obstructive sleep apnea (adult) (pediatric): Secondary | ICD-10-CM | POA: Diagnosis not present

## 2019-07-19 ENCOUNTER — Other Ambulatory Visit: Payer: Self-pay | Admitting: Family Medicine

## 2019-07-19 DIAGNOSIS — Z03818 Encounter for observation for suspected exposure to other biological agents ruled out: Secondary | ICD-10-CM | POA: Diagnosis not present

## 2019-07-19 DIAGNOSIS — R600 Localized edema: Secondary | ICD-10-CM

## 2019-07-20 ENCOUNTER — Other Ambulatory Visit: Payer: Self-pay | Admitting: Family Medicine

## 2019-07-28 ENCOUNTER — Ambulatory Visit
Admission: RE | Admit: 2019-07-28 | Discharge: 2019-07-28 | Disposition: A | Discharge status: Home / Self Care | Payer: Medicare Other | Source: Ambulatory Visit | Attending: Family Medicine | Admitting: Family Medicine

## 2019-07-28 ENCOUNTER — Other Ambulatory Visit: Payer: Self-pay

## 2019-07-28 DIAGNOSIS — R600 Localized edema: Secondary | ICD-10-CM

## 2019-07-28 MED ORDER — IOPAMIDOL (ISOVUE-300) INJECTION 61%
125.0000 mL | Freq: Once | INTRAVENOUS | Status: AC | PRN
  Administered 2019-07-28: 125 mL via INTRAVENOUS

## 2019-08-04 DIAGNOSIS — N3020 Other chronic cystitis without hematuria: Secondary | ICD-10-CM | POA: Diagnosis not present

## 2019-08-23 DIAGNOSIS — M961 Postlaminectomy syndrome, not elsewhere classified: Secondary | ICD-10-CM | POA: Diagnosis not present

## 2019-08-23 DIAGNOSIS — Z79899 Other long term (current) drug therapy: Secondary | ICD-10-CM | POA: Diagnosis not present

## 2019-08-23 DIAGNOSIS — G894 Chronic pain syndrome: Secondary | ICD-10-CM | POA: Diagnosis not present

## 2019-08-23 DIAGNOSIS — K5900 Constipation, unspecified: Secondary | ICD-10-CM | POA: Diagnosis not present

## 2019-08-23 DIAGNOSIS — R102 Pelvic and perineal pain: Secondary | ICD-10-CM | POA: Diagnosis not present

## 2019-08-23 DIAGNOSIS — M545 Low back pain: Secondary | ICD-10-CM | POA: Diagnosis not present

## 2019-08-23 DIAGNOSIS — M7918 Myalgia, other site: Secondary | ICD-10-CM | POA: Diagnosis not present

## 2019-08-23 DIAGNOSIS — M79606 Pain in leg, unspecified: Secondary | ICD-10-CM | POA: Diagnosis not present

## 2019-08-23 DIAGNOSIS — M47816 Spondylosis without myelopathy or radiculopathy, lumbar region: Secondary | ICD-10-CM | POA: Diagnosis not present

## 2019-08-23 DIAGNOSIS — M533 Sacrococcygeal disorders, not elsewhere classified: Secondary | ICD-10-CM | POA: Diagnosis not present

## 2019-08-23 DIAGNOSIS — Z79891 Long term (current) use of opiate analgesic: Secondary | ICD-10-CM | POA: Diagnosis not present

## 2019-09-19 DIAGNOSIS — M5136 Other intervertebral disc degeneration, lumbar region: Secondary | ICD-10-CM | POA: Diagnosis not present

## 2019-09-19 DIAGNOSIS — M47816 Spondylosis without myelopathy or radiculopathy, lumbar region: Secondary | ICD-10-CM | POA: Diagnosis not present

## 2019-09-20 DIAGNOSIS — M5442 Lumbago with sciatica, left side: Secondary | ICD-10-CM | POA: Diagnosis not present

## 2019-09-20 DIAGNOSIS — M533 Sacrococcygeal disorders, not elsewhere classified: Secondary | ICD-10-CM | POA: Diagnosis not present

## 2019-09-20 DIAGNOSIS — R102 Pelvic and perineal pain: Secondary | ICD-10-CM | POA: Diagnosis not present

## 2019-09-20 DIAGNOSIS — M47816 Spondylosis without myelopathy or radiculopathy, lumbar region: Secondary | ICD-10-CM | POA: Diagnosis not present

## 2019-09-20 DIAGNOSIS — M5441 Lumbago with sciatica, right side: Secondary | ICD-10-CM | POA: Diagnosis not present

## 2019-09-20 DIAGNOSIS — M961 Postlaminectomy syndrome, not elsewhere classified: Secondary | ICD-10-CM | POA: Diagnosis not present

## 2019-09-20 DIAGNOSIS — G8929 Other chronic pain: Secondary | ICD-10-CM | POA: Diagnosis not present

## 2019-09-20 DIAGNOSIS — Z5181 Encounter for therapeutic drug level monitoring: Secondary | ICD-10-CM | POA: Diagnosis not present

## 2019-09-20 DIAGNOSIS — G894 Chronic pain syndrome: Secondary | ICD-10-CM | POA: Diagnosis not present

## 2019-09-20 DIAGNOSIS — M7918 Myalgia, other site: Secondary | ICD-10-CM | POA: Diagnosis not present

## 2019-10-18 DIAGNOSIS — M533 Sacrococcygeal disorders, not elsewhere classified: Secondary | ICD-10-CM | POA: Diagnosis not present

## 2019-10-18 DIAGNOSIS — M961 Postlaminectomy syndrome, not elsewhere classified: Secondary | ICD-10-CM | POA: Diagnosis not present

## 2019-10-18 DIAGNOSIS — G894 Chronic pain syndrome: Secondary | ICD-10-CM | POA: Diagnosis not present

## 2019-10-18 DIAGNOSIS — M47816 Spondylosis without myelopathy or radiculopathy, lumbar region: Secondary | ICD-10-CM | POA: Diagnosis not present

## 2019-10-18 DIAGNOSIS — Z79891 Long term (current) use of opiate analgesic: Secondary | ICD-10-CM | POA: Diagnosis not present

## 2019-10-18 DIAGNOSIS — R102 Pelvic and perineal pain: Secondary | ICD-10-CM | POA: Diagnosis not present

## 2019-10-18 DIAGNOSIS — M5441 Lumbago with sciatica, right side: Secondary | ICD-10-CM | POA: Diagnosis not present

## 2019-10-18 DIAGNOSIS — Z79899 Other long term (current) drug therapy: Secondary | ICD-10-CM | POA: Diagnosis not present

## 2019-10-18 DIAGNOSIS — M5442 Lumbago with sciatica, left side: Secondary | ICD-10-CM | POA: Diagnosis not present

## 2019-10-18 DIAGNOSIS — M7918 Myalgia, other site: Secondary | ICD-10-CM | POA: Diagnosis not present

## 2019-11-04 ENCOUNTER — Other Ambulatory Visit: Payer: Self-pay | Admitting: *Deleted

## 2019-11-04 DIAGNOSIS — R600 Localized edema: Secondary | ICD-10-CM

## 2019-11-08 DIAGNOSIS — R5083 Postvaccination fever: Secondary | ICD-10-CM | POA: Diagnosis not present

## 2019-11-08 DIAGNOSIS — R1011 Right upper quadrant pain: Secondary | ICD-10-CM | POA: Diagnosis not present

## 2019-11-09 DIAGNOSIS — Z03818 Encounter for observation for suspected exposure to other biological agents ruled out: Secondary | ICD-10-CM | POA: Diagnosis not present

## 2019-11-10 ENCOUNTER — Encounter: Payer: Medicare Other | Admitting: Vascular Surgery

## 2019-11-10 ENCOUNTER — Encounter (HOSPITAL_COMMUNITY): Payer: Medicare Other

## 2019-11-29 DIAGNOSIS — G894 Chronic pain syndrome: Secondary | ICD-10-CM | POA: Diagnosis not present

## 2019-11-29 DIAGNOSIS — M5416 Radiculopathy, lumbar region: Secondary | ICD-10-CM | POA: Diagnosis not present

## 2019-11-29 DIAGNOSIS — M7918 Myalgia, other site: Secondary | ICD-10-CM | POA: Diagnosis not present

## 2019-11-29 DIAGNOSIS — M47816 Spondylosis without myelopathy or radiculopathy, lumbar region: Secondary | ICD-10-CM | POA: Diagnosis not present

## 2019-11-29 DIAGNOSIS — M461 Sacroiliitis, not elsewhere classified: Secondary | ICD-10-CM | POA: Diagnosis not present

## 2019-11-29 DIAGNOSIS — G8929 Other chronic pain: Secondary | ICD-10-CM | POA: Diagnosis not present

## 2019-12-13 DIAGNOSIS — L570 Actinic keratosis: Secondary | ICD-10-CM | POA: Diagnosis not present

## 2019-12-13 DIAGNOSIS — D485 Neoplasm of uncertain behavior of skin: Secondary | ICD-10-CM | POA: Diagnosis not present

## 2019-12-15 DIAGNOSIS — Z5181 Encounter for therapeutic drug level monitoring: Secondary | ICD-10-CM | POA: Diagnosis not present

## 2020-01-03 DIAGNOSIS — M47816 Spondylosis without myelopathy or radiculopathy, lumbar region: Secondary | ICD-10-CM | POA: Diagnosis not present

## 2020-01-03 DIAGNOSIS — M7918 Myalgia, other site: Secondary | ICD-10-CM | POA: Diagnosis not present

## 2020-01-03 DIAGNOSIS — G894 Chronic pain syndrome: Secondary | ICD-10-CM | POA: Diagnosis not present

## 2020-01-03 DIAGNOSIS — M533 Sacrococcygeal disorders, not elsewhere classified: Secondary | ICD-10-CM | POA: Diagnosis not present

## 2020-01-03 DIAGNOSIS — R102 Pelvic and perineal pain: Secondary | ICD-10-CM | POA: Diagnosis not present

## 2020-01-03 DIAGNOSIS — M5416 Radiculopathy, lumbar region: Secondary | ICD-10-CM | POA: Diagnosis not present

## 2020-02-07 DIAGNOSIS — K5900 Constipation, unspecified: Secondary | ICD-10-CM | POA: Diagnosis not present

## 2020-02-07 DIAGNOSIS — R935 Abnormal findings on diagnostic imaging of other abdominal regions, including retroperitoneum: Secondary | ICD-10-CM | POA: Diagnosis not present

## 2020-02-07 DIAGNOSIS — R1011 Right upper quadrant pain: Secondary | ICD-10-CM | POA: Diagnosis not present

## 2020-02-09 ENCOUNTER — Other Ambulatory Visit: Payer: Self-pay | Admitting: Physician Assistant

## 2020-02-09 DIAGNOSIS — R109 Unspecified abdominal pain: Secondary | ICD-10-CM

## 2020-02-21 DIAGNOSIS — Z79891 Long term (current) use of opiate analgesic: Secondary | ICD-10-CM | POA: Diagnosis not present

## 2020-02-21 DIAGNOSIS — M5432 Sciatica, left side: Secondary | ICD-10-CM | POA: Diagnosis not present

## 2020-02-21 DIAGNOSIS — M5416 Radiculopathy, lumbar region: Secondary | ICD-10-CM | POA: Diagnosis not present

## 2020-02-21 DIAGNOSIS — M47816 Spondylosis without myelopathy or radiculopathy, lumbar region: Secondary | ICD-10-CM | POA: Diagnosis not present

## 2020-02-21 DIAGNOSIS — M461 Sacroiliitis, not elsewhere classified: Secondary | ICD-10-CM | POA: Diagnosis not present

## 2020-02-21 DIAGNOSIS — M533 Sacrococcygeal disorders, not elsewhere classified: Secondary | ICD-10-CM | POA: Diagnosis not present

## 2020-02-21 DIAGNOSIS — M4726 Other spondylosis with radiculopathy, lumbar region: Secondary | ICD-10-CM | POA: Diagnosis not present

## 2020-02-21 DIAGNOSIS — M961 Postlaminectomy syndrome, not elsewhere classified: Secondary | ICD-10-CM | POA: Diagnosis not present

## 2020-02-21 DIAGNOSIS — G894 Chronic pain syndrome: Secondary | ICD-10-CM | POA: Diagnosis not present

## 2020-02-21 DIAGNOSIS — M5431 Sciatica, right side: Secondary | ICD-10-CM | POA: Diagnosis not present

## 2020-02-21 DIAGNOSIS — Z79899 Other long term (current) drug therapy: Secondary | ICD-10-CM | POA: Diagnosis not present

## 2020-02-21 DIAGNOSIS — M7918 Myalgia, other site: Secondary | ICD-10-CM | POA: Diagnosis not present

## 2020-03-09 ENCOUNTER — Ambulatory Visit
Admission: RE | Admit: 2020-03-09 | Discharge: 2020-03-09 | Disposition: A | Discharge status: Home / Self Care | Payer: Medicare Other | Source: Ambulatory Visit | Attending: Physician Assistant | Admitting: Physician Assistant

## 2020-03-09 ENCOUNTER — Other Ambulatory Visit: Payer: Self-pay

## 2020-03-09 DIAGNOSIS — Z9049 Acquired absence of other specified parts of digestive tract: Secondary | ICD-10-CM | POA: Diagnosis not present

## 2020-03-09 DIAGNOSIS — K838 Other specified diseases of biliary tract: Secondary | ICD-10-CM | POA: Diagnosis not present

## 2020-03-09 DIAGNOSIS — R109 Unspecified abdominal pain: Secondary | ICD-10-CM | POA: Diagnosis not present

## 2020-03-09 DIAGNOSIS — R935 Abnormal findings on diagnostic imaging of other abdominal regions, including retroperitoneum: Secondary | ICD-10-CM | POA: Diagnosis not present

## 2020-03-09 MED ORDER — GADOBENATE DIMEGLUMINE 529 MG/ML IV SOLN
20.0000 mL | Freq: Once | INTRAVENOUS | Status: AC | PRN
  Administered 2020-03-09: 20 mL via INTRAVENOUS

## 2020-04-03 DIAGNOSIS — R1011 Right upper quadrant pain: Secondary | ICD-10-CM | POA: Diagnosis not present

## 2020-04-03 DIAGNOSIS — K648 Other hemorrhoids: Secondary | ICD-10-CM | POA: Diagnosis not present

## 2020-04-03 DIAGNOSIS — Z1211 Encounter for screening for malignant neoplasm of colon: Secondary | ICD-10-CM | POA: Diagnosis not present

## 2020-04-10 DIAGNOSIS — Z20828 Contact with and (suspected) exposure to other viral communicable diseases: Secondary | ICD-10-CM | POA: Diagnosis not present

## 2020-04-10 DIAGNOSIS — M5416 Radiculopathy, lumbar region: Secondary | ICD-10-CM | POA: Diagnosis not present

## 2020-04-10 DIAGNOSIS — M533 Sacrococcygeal disorders, not elsewhere classified: Secondary | ICD-10-CM | POA: Diagnosis not present

## 2020-04-10 DIAGNOSIS — M7918 Myalgia, other site: Secondary | ICD-10-CM | POA: Diagnosis not present

## 2020-04-10 DIAGNOSIS — M961 Postlaminectomy syndrome, not elsewhere classified: Secondary | ICD-10-CM | POA: Diagnosis not present

## 2020-04-10 DIAGNOSIS — R102 Pelvic and perineal pain: Secondary | ICD-10-CM | POA: Diagnosis not present

## 2020-04-10 DIAGNOSIS — M47816 Spondylosis without myelopathy or radiculopathy, lumbar region: Secondary | ICD-10-CM | POA: Diagnosis not present

## 2020-04-19 ENCOUNTER — Other Ambulatory Visit: Payer: Self-pay | Admitting: Gastroenterology

## 2020-05-01 DIAGNOSIS — N3281 Overactive bladder: Secondary | ICD-10-CM | POA: Diagnosis not present

## 2020-05-01 DIAGNOSIS — R296 Repeated falls: Secondary | ICD-10-CM | POA: Diagnosis not present

## 2020-05-01 DIAGNOSIS — Z86711 Personal history of pulmonary embolism: Secondary | ICD-10-CM | POA: Diagnosis not present

## 2020-05-01 DIAGNOSIS — Z0000 Encounter for general adult medical examination without abnormal findings: Secondary | ICD-10-CM | POA: Diagnosis not present

## 2020-05-01 DIAGNOSIS — G8220 Paraplegia, unspecified: Secondary | ICD-10-CM | POA: Diagnosis not present

## 2020-05-01 DIAGNOSIS — G894 Chronic pain syndrome: Secondary | ICD-10-CM | POA: Diagnosis not present

## 2020-05-01 DIAGNOSIS — R609 Edema, unspecified: Secondary | ICD-10-CM | POA: Diagnosis not present

## 2020-05-01 DIAGNOSIS — G4733 Obstructive sleep apnea (adult) (pediatric): Secondary | ICD-10-CM | POA: Diagnosis not present

## 2020-05-09 ENCOUNTER — Encounter (HOSPITAL_COMMUNITY): Payer: Self-pay | Admitting: Gastroenterology

## 2020-05-09 ENCOUNTER — Other Ambulatory Visit: Payer: Self-pay

## 2020-05-12 ENCOUNTER — Other Ambulatory Visit (HOSPITAL_COMMUNITY)
Admission: RE | Admit: 2020-05-12 | Discharge: 2020-05-12 | Disposition: A | Discharge status: Home / Self Care | Payer: Medicare Other | Source: Ambulatory Visit | Attending: Gastroenterology | Admitting: Gastroenterology

## 2020-05-12 DIAGNOSIS — Z20822 Contact with and (suspected) exposure to covid-19: Secondary | ICD-10-CM | POA: Diagnosis not present

## 2020-05-12 DIAGNOSIS — Z01812 Encounter for preprocedural laboratory examination: Secondary | ICD-10-CM | POA: Diagnosis not present

## 2020-05-13 LAB — SARS CORONAVIRUS 2 (TAT 6-24 HRS): SARS Coronavirus 2: NEGATIVE

## 2020-05-16 ENCOUNTER — Encounter (HOSPITAL_COMMUNITY)
Admission: RE | Disposition: A | Discharge status: Home / Self Care | Payer: Self-pay | Source: Home / Self Care | Attending: Gastroenterology

## 2020-05-16 ENCOUNTER — Ambulatory Visit (HOSPITAL_COMMUNITY)
Admission: RE | Admit: 2020-05-16 | Discharge: 2020-05-16 | Disposition: A | Discharge status: Home / Self Care | Payer: Medicare Other | Attending: Gastroenterology | Admitting: Gastroenterology

## 2020-05-16 ENCOUNTER — Encounter (HOSPITAL_COMMUNITY): Payer: Self-pay | Admitting: Gastroenterology

## 2020-05-16 ENCOUNTER — Ambulatory Visit (HOSPITAL_COMMUNITY): Payer: Medicare Other | Admitting: Certified Registered"

## 2020-05-16 DIAGNOSIS — Z1211 Encounter for screening for malignant neoplasm of colon: Secondary | ICD-10-CM | POA: Diagnosis not present

## 2020-05-16 DIAGNOSIS — Z6841 Body Mass Index (BMI) 40.0 and over, adult: Secondary | ICD-10-CM | POA: Insufficient documentation

## 2020-05-16 DIAGNOSIS — Z9049 Acquired absence of other specified parts of digestive tract: Secondary | ICD-10-CM | POA: Diagnosis not present

## 2020-05-16 DIAGNOSIS — R1013 Epigastric pain: Secondary | ICD-10-CM | POA: Diagnosis not present

## 2020-05-16 DIAGNOSIS — K869 Disease of pancreas, unspecified: Secondary | ICD-10-CM | POA: Diagnosis not present

## 2020-05-16 DIAGNOSIS — K2970 Gastritis, unspecified, without bleeding: Secondary | ICD-10-CM | POA: Diagnosis not present

## 2020-05-16 DIAGNOSIS — K3189 Other diseases of stomach and duodenum: Secondary | ICD-10-CM | POA: Diagnosis not present

## 2020-05-16 DIAGNOSIS — K319 Disease of stomach and duodenum, unspecified: Secondary | ICD-10-CM | POA: Insufficient documentation

## 2020-05-16 DIAGNOSIS — I2699 Other pulmonary embolism without acute cor pulmonale: Secondary | ICD-10-CM | POA: Insufficient documentation

## 2020-05-16 DIAGNOSIS — I82409 Acute embolism and thrombosis of unspecified deep veins of unspecified lower extremity: Secondary | ICD-10-CM | POA: Diagnosis not present

## 2020-05-16 DIAGNOSIS — G4730 Sleep apnea, unspecified: Secondary | ICD-10-CM | POA: Insufficient documentation

## 2020-05-16 DIAGNOSIS — R1011 Right upper quadrant pain: Secondary | ICD-10-CM | POA: Diagnosis not present

## 2020-05-16 DIAGNOSIS — Z7901 Long term (current) use of anticoagulants: Secondary | ICD-10-CM | POA: Insufficient documentation

## 2020-05-16 HISTORY — DX: Presence of spectacles and contact lenses: Z97.3

## 2020-05-16 HISTORY — PX: COLONOSCOPY WITH PROPOFOL: SHX5780

## 2020-05-16 HISTORY — PX: ESOPHAGOGASTRODUODENOSCOPY (EGD) WITH PROPOFOL: SHX5813

## 2020-05-16 HISTORY — DX: Personal history of other (healed) physical injury and trauma: Z87.828

## 2020-05-16 HISTORY — PX: BIOPSY: SHX5522

## 2020-05-16 HISTORY — PX: UPPER ESOPHAGEAL ENDOSCOPIC ULTRASOUND (EUS): SHX6562

## 2020-05-16 SURGERY — UPPER ESOPHAGEAL ENDOSCOPIC ULTRASOUND (EUS)
Anesthesia: Monitor Anesthesia Care

## 2020-05-16 MED ORDER — PROPOFOL 10 MG/ML IV BOLUS
INTRAVENOUS | Status: DC | PRN
  Administered 2020-05-16: 20 mg via INTRAVENOUS

## 2020-05-16 MED ORDER — PROPOFOL 500 MG/50ML IV EMUL
INTRAVENOUS | Status: DC | PRN
  Administered 2020-05-16: 150 ug/kg/min via INTRAVENOUS

## 2020-05-16 MED ORDER — PROPOFOL 10 MG/ML IV BOLUS
INTRAVENOUS | Status: AC
  Filled 2020-05-16: qty 20

## 2020-05-16 MED ORDER — SODIUM CHLORIDE 0.9 % IV SOLN: INTRAVENOUS | Status: DC

## 2020-05-16 MED ORDER — PROPOFOL 1000 MG/100ML IV EMUL
INTRAVENOUS | Status: AC
  Filled 2020-05-16: qty 100

## 2020-05-16 MED ORDER — LACTATED RINGERS IV SOLN
INTRAVENOUS | Status: AC | PRN
  Administered 2020-05-16: 1000 mL via INTRAVENOUS

## 2020-05-16 MED ORDER — RIVAROXABAN 10 MG PO TABS
10.0000 mg | ORAL_TABLET | Freq: Every day | ORAL | Status: DC
Start: 2020-05-17 — End: 2023-08-27

## 2020-05-16 SURGICAL SUPPLY — 24 items

## 2020-05-16 NOTE — Anesthesia Procedure Notes (Signed)
Procedure Name: MAC Date/Time: 05/16/2020 9:45 AM Performed by: Eben Burow, CRNA Pre-anesthesia Checklist: Patient identified, Emergency Drugs available, Suction available, Patient being monitored and Timeout performed Oxygen Delivery Method: Simple face mask Placement Confirmation: positive ETCO2

## 2020-05-16 NOTE — H&P (Signed)
Patient interval history reviewed.  Patient examined again.  There has been no change from documented H/P scanned into chart from our office) except as documented below.  Assessment:  1.  RUQ pain.  History cholecystectomy. 2.  CBD dilatation with normal LFTs and normal MRCP. 3.  Colon cancer screening. 4.  Chronic anticoagulation--rivaroxaban on hold x 3 days.  Plan:  1.  Upper endoscopy and upper endoscopic ultrasound for further evaluation. 2.  Risks (bleeding, infection, bowel perforation that could require surgery, sedation-related changes in cardiopulmonary systems), benefits (identification and possible treatment of source of symptoms, exclusion of certain causes of symptoms), and alternatives (watchful waiting, radiographic imaging studies, empiric medical treatment) of upper endoscopy and upper endoscopy with ultrasound (EGD + EUS) were explained to patient/family in detail and patient wishes to proceed. 3.  Risks (bleeding, infection, bowel perforation that could require surgery, sedation-related changes in cardiopulmonary systems), benefits (identification and possible treatment of source of symptoms, exclusion of certain causes of symptoms), and alternatives (watchful waiting, radiographic imaging studies, empiric medical treatment) of colonoscopy were explained to patient/family in detail and patient wishes to proceed.

## 2020-05-16 NOTE — Op Note (Signed)
[facility] Patient Name: [name] Procedure Date: 05/16/2020 MRN: [id number] Attending MD: Arta Silence , MD Date of Birth: [birth year] CSN: [id number] Age: 49 Admit Type: Outpatient Procedure:                Upper EUS Indications:              Epigastric abdominal pain, Abdominal pain in the                            right upper quadrant Providers:                Arta Silence, MD, Clyde Lundborg, RN, Elspeth Cho Tech., Technician, Lesia Sago,                            Technician, Jefm Miles CRNA Referring MD:              Medicines:                Monitored Anesthesia Care Complications:            No immediate complications. Estimated Blood Loss:     Estimated blood loss: none. Estimated blood loss:                            none. Procedure:                Pre-Anesthesia Assessment:                           - Prior to the procedure, a History and Physical                            was performed, and patient medications and                            allergies were reviewed. The patient's tolerance of                            previous anesthesia was also reviewed. The risks                            and benefits of the procedure and the sedation                            options and risks were discussed with the patient.                            All questions were answered, and informed consent                            was obtained. Prior Anticoagulants: The patient has                            taken Xarelto (rivaroxaban), last dose  was 3 days                            prior to procedure. ASA Grade Assessment: IV - A                            patient with severe systemic disease that is a                            constant threat to life. After reviewing the risks                            and benefits, the patient was deemed in                            satisfactory condition to undergo the  procedure.                           After obtaining informed consent, the endoscope was                            passed under direct vision. Throughout the                            procedure, the patient's blood pressure, pulse, and                            oxygen saturations were monitored continuously. The                            [id number] ([id number] Olympus Radial EUS was                            introduced through the mouth, and advanced to the                            second part of duodenum. The upper EUS was                            accomplished without difficulty. The patient                            tolerated the procedure well. Scope In: Scope Out: Findings:      ENDOSCOPIC FINDING: :      The examined esophagus was normal.      Patchy mild inflammation was found in the gastric fundus and in the       prepyloric region of the stomach. Biopsies were taken with a cold       forceps for histology.      The exam of the stomach was otherwise normal.      The duodenal bulb, first portion of the duodenum and second portion of       the duodenum were normal.      ENDOSONOGRAPHIC FINDING: :      Endosonographic images of the stomach  were unremarkable.      Pancreatic parenchymal abnormalities were noted in the entire pancreas.       These consisted of diffuse echogenicity, hyperechoic strands and       hyperechoic foci. Most consistent with fatty pancreas.      No lymphadenopathy seen.      Evidence of a previous cholecystectomy was identified       endosonographically.      There was no sign of significant endosonographic abnormality in the       common bile duct. The maximum diameter of the duct was 8 mm. No masses,       no calcifications, no stones and no biliary sludge were identified.      There was no sign of significant endosonographic abnormality in the       ampulla. Impression:               - Normal esophagus.                           - Gastritis.  Biopsied.                           - Normal duodenal bulb, first portion of the                            duodenum and second portion of the duodenum.                           - Endosonographic images of the stomach were                            unremarkable.                           - Pancreatic parenchymal abnormalities consisting                            of diffuse echogenicity, hyperechoic strands and                            hyperechoic foci were noted in the entire pancreas.                           - Evidence of a cholecystectomy.                           - There was no sign of significant pathology in the                            common bile duct.                           - There was no sign of significant pathology in the                            ampulla. Moderate Sedation:      None Recommendation:           -  Await path results.                           - Perform a colonoscopy today. Procedure Code(s):        --- Professional ---                           [phone], Esophagogastroduodenoscopy, flexible,                            transoral; with endoscopic ultrasound examination,                            including the esophagus, stomach, and either the                            duodenum or a surgically altered stomach where the                            jejunum is examined distal to the anastomosis                           43239, Esophagogastroduodenoscopy, flexible,                            transoral; with biopsy, single or multiple Diagnosis Code(s):        --- Professional ---                           K29.70, Gastritis, unspecified, without bleeding                           K86.9, Disease of pancreas, unspecified                           Z90.49, Acquired absence of other specified parts                            of digestive tract                           R10.13, Epigastric pain                           R10.11, Right upper quadrant pain CPT  copyright 2019 American Medical Association. All rights reserved. The codes documented in this report are preliminary and upon coder review may  be revised to meet current compliance requirements. Arta Silence, MD 05/16/2020 11:26:50 AM This report has been signed electronically. Number of Addenda: 0

## 2020-05-16 NOTE — Anesthesia Postprocedure Evaluation (Signed)
Anesthesia Post Note  Patient: [name]  Procedure(s) Performed: UPPER ESOPHAGEAL ENDOSCOPIC ULTRASOUND (EUS) (N/A ) ESOPHAGOGASTRODUODENOSCOPY (EGD) WITH PROPOFOL (N/A ) COLONOSCOPY WITH PROPOFOL (N/A ) BIOPSY     Patient location during evaluation: PACU Anesthesia Type: MAC Level of consciousness: awake and alert Pain management: pain level controlled Vital Signs Assessment: post-procedure vital signs reviewed and stable Respiratory status: spontaneous breathing, nonlabored ventilation, respiratory function stable and patient connected to nasal cannula oxygen Cardiovascular status: stable and blood pressure returned to baseline Postop Assessment: no apparent nausea or vomiting Anesthetic complications: no   No complications documented.  Last Vitals:  Vitals:   05/16/20 1100 05/16/20 1110  BP: (!) 148/72 (!) 143/68  Pulse: 69 74  Resp: 19 13  Temp:    SpO2: 98% 96%    Last Pain:  Vitals:   05/16/20 1110  TempSrc:   PainSc: 0-No pain                 Jerry Clyne P Jatia Musa

## 2020-05-16 NOTE — Anesthesia Preprocedure Evaluation (Addendum)
Anesthesia Evaluation  Patient identified by MRN, date of birth, ID band Patient awake    Reviewed: Allergy & Precautions, NPO status , Patient's Chart, lab work & pertinent test results  Airway Mallampati: I  TM Distance: >3 FB Neck ROM: Full    Dental no notable dental hx.    Pulmonary sleep apnea , PE   Pulmonary exam normal breath sounds clear to auscultation       Cardiovascular + DVT  Normal cardiovascular exam Rhythm:Regular Rate:Normal     Neuro/Psych negative neurological ROS  negative psych ROS   GI/Hepatic negative GI ROS, Neg liver ROS, Bowel prep,  Endo/Other  Morbid obesity  Renal/GU negative Renal ROS     Musculoskeletal negative musculoskeletal ROS (+)   Abdominal (+) + obese,   Peds  Hematology negative hematology ROS (+)   Anesthesia Other Findings Colon cancer screening RUQ Abdominal Pain  Reproductive/Obstetrics                            Anesthesia Physical Anesthesia Plan  ASA: IV  Anesthesia Plan: MAC   Post-op Pain Management:    Induction: Intravenous  PONV Risk Score and Plan: 2 and Propofol infusion and Treatment may vary due to age or medical condition  Airway Management Planned: Nasal Cannula  Additional Equipment:   Intra-op Plan:   Post-operative Plan:   Informed Consent: I have reviewed the patients History and Physical, chart, labs and discussed the procedure including the risks, benefits and alternatives for the proposed anesthesia with the patient or authorized representative who has indicated his/her understanding and acceptance.     Dental advisory given  Plan Discussed with: CRNA  Anesthesia Plan Comments:        Anesthesia Quick Evaluation

## 2020-05-16 NOTE — Transfer of Care (Signed)
Immediate Anesthesia Transfer of Care Note  Patient: [name]  Procedure(s) Performed: UPPER ESOPHAGEAL ENDOSCOPIC ULTRASOUND (EUS) (N/A ) ESOPHAGOGASTRODUODENOSCOPY (EGD) WITH PROPOFOL (N/A ) COLONOSCOPY WITH PROPOFOL (N/A ) BIOPSY  Patient Location: PACU and Endoscopy Unit  Anesthesia Type:MAC  Level of Consciousness: awake, alert  and patient cooperative  Airway & Oxygen Therapy: Patient Spontanous Breathing and Patient connected to face mask oxygen  Post-op Assessment: Report given to RN and Post -op Vital signs reviewed and stable  Post vital signs: Reviewed and stable  Last Vitals:  Vitals Value Taken Time  BP    Temp    Pulse 77 05/16/20 1055  Resp 19 05/16/20 1055  SpO2 97 % 05/16/20 1055  Vitals shown include unvalidated device data.  Last Pain:  Vitals:   05/16/20 0853  TempSrc: Oral  PainSc: 6       Patients Stated Pain Goal: 6 (30/94/07 6808)  Complications: No complications documented.

## 2020-05-16 NOTE — Discharge Instructions (Signed)
Endoscopy Care After Please read the instructions outlined below and refer to this sheet in the next few weeks. These discharge instructions provide you with general information on caring for yourself after you leave the hospital. Your doctor may also give you specific instructions. While your treatment has been planned according to the most current medical practices available, unavoidable complications occasionally occur. If you have any problems or questions after discharge, please call Dr. Alla Sloma ([facility]) at [phone].  HOME CARE INSTRUCTIONS Activity You may resume your regular activity but move at a slower pace for the next 24 hours.  Take frequent rest periods for the next 24 hours.  Walking will help expel (get rid of) the air and reduce the bloated feeling in your abdomen.  No driving for 24 hours (because of the anesthesia (medicine) used during the test).  You may shower.  Do not sign any important legal documents or operate any machinery for 24 hours (because of the anesthesia used during the test).  Nutrition Drink plenty of fluids.  You may resume your normal diet.  Begin with a light meal and progress to your normal diet.  Avoid alcoholic beverages for 24 hours or as instructed by your caregiver.  Medications You may resume your normal medications unless your caregiver tells you otherwise. What you can expect today You may experience abdominal discomfort such as a feeling of fullness or "gas" pains.  You may experience a sore throat for 2 to 3 days. This is normal. Gargling with salt water may help this.   SEEK IMMEDIATE MEDICAL CARE IF: You have excessive nausea (feeling sick to your stomach) and/or vomiting.  You have severe abdominal pain and distention (swelling).  You have trouble swallowing.  You have a temperature over 100 F (37.8 C).  You have rectal bleeding or vomiting of blood.  Document Released: 02/26/2004 Document Revised: 03/26/2011  Document Reviewed: 09/08/2007 ExitCare Patient Information 2012 [employer]   Colonoscopy  Post procedure instructions:  Read the instructions outlined below and refer to this sheet in the next few weeks. These discharge instructions provide you with general information on caring for yourself after you leave the hospital. Your doctor may also give you specific instructions. While your treatment has been planned according to the most current medical practices available, unavoidable complications occasionally occur. If you have any problems or questions after discharge, call Dr. Branndon Tuite at [facility] ([phone]).  HOME CARE INSTRUCTIONS  ACTIVITY: You may resume your regular activity, but move at a slower pace for the next 24 hours.  Take frequent rest periods for the next 24 hours.  Walking will help get rid of the air and reduce the bloated feeling in your belly (abdomen).  No driving for 24 hours (because of the medicine (anesthesia) used during the test).  You may shower.  Do not sign any important legal documents or operate any machinery for 24 hours (because of the anesthesia used during the test).  NUTRITION: Drink plenty of fluids.  You may resume your normal diet as instructed by your doctor.  Begin with a light meal and progress to your normal diet. Heavy or fried foods are harder to digest and may make you feel sick to your stomach (nauseated).  Avoid alcoholic beverages for 24 hours or as instructed.  MEDICATIONS: You may resume your normal medications unless your doctor tells you otherwise.  WHAT TO EXPECT TODAY: Some feelings of bloating in the abdomen.  Passage of more gas than usual.  Spotting   of blood in your stool or on the toilet paper.  IF YOU HAD POLYPS REMOVED DURING THE COLONOSCOPY: No aspirin products for 7 days or as instructed.  No alcohol for 7 days or as instructed.  Eat a soft diet for the next 24 hours.   FINDING OUT THE RESULTS OF YOUR  TEST  Not all test results are available during your visit. If your test results are not back during the visit, make an appointment with your caregiver to find out the results. Do not assume everything is normal if you have not heard from your caregiver or the medical facility. It is important for you to follow up on all of your test results.     SEEK IMMEDIATE MEDICAL CARE IF:  You have more than a spotting of blood in your stool.  Your belly is swollen (abdominal distention).  You are nauseated or vomiting.  You have a fever.  You have abdominal pain or discomfort that is severe or gets worse throughout the day.    Document Released: 02/26/2004 Document Revised: 03/26/2011 Document Reviewed: 02/24/2008 ExitCare Patient Information 2012 [employer].  

## 2020-05-16 NOTE — Op Note (Signed)
[facility] Patient Name: [name] Procedure Date: 05/16/2020 MRN: [id number] Attending MD: Arta Silence , MD Date of Birth: 1971/06/03 CSN: [id number] Age: 49 Admit Type: Outpatient Procedure:                Colonoscopy Indications:              Screening for colorectal malignant neoplasm, This                            is the patient's first colonoscopy Providers:                Arta Silence, MD, Clyde Lundborg, RN, Elspeth Cho Tech., Technician, Lesia Sago,                            Technician, Jefm Miles CRNA Referring MD:              Medicines:                Monitored Anesthesia Care Complications:            No immediate complications. Estimated Blood Loss:     Estimated blood loss: none. Procedure:                Pre-Anesthesia Assessment:                           - Prior to the procedure, a History and Physical                            was performed, and patient medications and                            allergies were reviewed. The patient's tolerance of                            previous anesthesia was also reviewed. The risks                            and benefits of the procedure and the sedation                            options and risks were discussed with the patient.                            All questions were answered, and informed consent                            was obtained. Prior Anticoagulants: The patient has                            taken Xarelto (rivaroxaban), last dose was 3 days                            prior  to procedure. ASA Grade Assessment: IV - A                            patient with severe systemic disease that is a                            constant threat to life. After reviewing the risks                            and benefits, the patient was deemed in                            satisfactory condition to undergo the procedure.                           -  Prior to the procedure, a History and Physical                            was performed, and patient medications and                            allergies were reviewed. The patient's tolerance of                            previous anesthesia was also reviewed. The risks                            and benefits of the procedure and the sedation                            options and risks were discussed with the patient.                            All questions were answered, and informed consent                            was obtained. Prior Anticoagulants: The patient has                            taken Xarelto (rivaroxaban), last dose was 3 days                            prior to procedure. ASA Grade Assessment: IV - A                            patient with severe systemic disease that is a                            constant threat to life. After reviewing the risks                            and benefits, the patient was deemed in  satisfactory condition to undergo the procedure.                           After obtaining informed consent, the colonoscope                            was passed under direct vision. Throughout the                            procedure, the patient's blood pressure, pulse, and                            oxygen saturations were monitored continuously. The                            [id number] ([id number] Olympus colonoscope was                            introduced through the anus and advanced to the the                            cecum, identified by appendiceal orifice and                            ileocecal valve. The ileocecal valve, appendiceal                            orifice, and rectum were photographed. The entire                            colon was examined. The colonoscopy was performed                            without difficulty. The patient tolerated the                            procedure well. The quality of the  bowel                            preparation was good. Scope In: 10:23:20 AM Scope Out: 10:41:40 AM Scope Withdrawal Time: 0 hours 12 minutes 44 seconds  Total Procedure Duration: 0 hours 18 minutes 20 seconds  Findings:      The perianal and digital rectal examinations were normal.      The entire examined colon appeared normal on direct and retroflexion       views.      Colon normal; no polyps, masses, vascular ectasias, or inflammatory       changes were seen.      The retroflexed view of the distal rectum and anal verge was normal and       showed no anal or rectal abnormalities. Impression:               - The entire examined colon is normal on direct and  retroflexion views.                           - The distal rectum and anal verge are normal on                            retroflexion view. Moderate Sedation:      None Recommendation:           - Patient has a contact number available for                            emergencies. The signs and symptoms of potential                            delayed complications were discussed with the                            patient. Return to normal activities tomorrow.                            Written discharge instructions were provided to the                            patient.                           - Discharge patient to home (via wheelchair).                           - Resume previous diet today.                           - Continue present medications.                           - Resume Xarelto (rivaroxaban) at prior dose                            tomorrow.                           - Await pathology results.                           - Repeat colonoscopy in 10 years for screening                            purposes.                           - Return to GI clinic after studies are complete.                           - Return to referring physician as previously                             scheduled.  Procedure Code(s):        --- Professional ---                           [phone], Colonoscopy, flexible; diagnostic, including                            collection of specimen(s) by brushing or washing,                            when performed (separate procedure) Diagnosis Code(s):        --- Professional ---                           Z12.11, Encounter for screening for malignant                            neoplasm of colon CPT copyright 2019 American Medical Association. All rights reserved. The codes documented in this report are preliminary and upon coder review may  be revised to meet current compliance requirements. Arta Silence, MD 05/16/2020 11:29:48 AM This report has been signed electronically. Number of Addenda: 0

## 2020-05-17 ENCOUNTER — Other Ambulatory Visit: Payer: Self-pay

## 2020-05-17 LAB — SURGICAL PATHOLOGY

## 2020-05-18 ENCOUNTER — Encounter (HOSPITAL_COMMUNITY): Payer: Self-pay | Admitting: Gastroenterology

## 2020-06-12 DIAGNOSIS — G894 Chronic pain syndrome: Secondary | ICD-10-CM | POA: Diagnosis not present

## 2020-06-12 DIAGNOSIS — M7918 Myalgia, other site: Secondary | ICD-10-CM | POA: Diagnosis not present

## 2020-06-12 DIAGNOSIS — M461 Sacroiliitis, not elsewhere classified: Secondary | ICD-10-CM | POA: Diagnosis not present

## 2020-06-12 DIAGNOSIS — M62838 Other muscle spasm: Secondary | ICD-10-CM | POA: Diagnosis not present

## 2020-06-12 DIAGNOSIS — M542 Cervicalgia: Secondary | ICD-10-CM | POA: Diagnosis not present

## 2020-06-12 DIAGNOSIS — M47816 Spondylosis without myelopathy or radiculopathy, lumbar region: Secondary | ICD-10-CM | POA: Diagnosis not present

## 2020-08-10 ENCOUNTER — Ambulatory Visit (INDEPENDENT_AMBULATORY_CARE_PROVIDER_SITE_OTHER): Payer: Medicare Other

## 2020-08-10 ENCOUNTER — Ambulatory Visit (INDEPENDENT_AMBULATORY_CARE_PROVIDER_SITE_OTHER): Payer: Medicare Other | Admitting: Podiatry

## 2020-08-10 ENCOUNTER — Other Ambulatory Visit: Payer: Self-pay

## 2020-08-10 DIAGNOSIS — M79672 Pain in left foot: Secondary | ICD-10-CM

## 2020-08-10 DIAGNOSIS — M79671 Pain in right foot: Secondary | ICD-10-CM | POA: Diagnosis not present

## 2020-08-13 NOTE — Progress Notes (Signed)
Subjective:   Patient ID: [name], female   DOB: 50 y.o.   MRN: [id number]   HPI Patient states she has had generalized foot pain right over left secondary to activity and states she does not remember specific injury and does not remember anything else as far as pathology.  Patient does not smoke likes to be active   Review of Systems  All other systems reviewed and are negative.       Objective:  Physical Exam Vitals and nursing note reviewed.  Constitutional:      Appearance: She is well-developed and well-nourished.  Cardiovascular:     Pulses: Intact distal pulses.  Pulmonary:     Effort: Pulmonary effort is normal.  Musculoskeletal:        General: Normal range of motion.  Skin:    General: Skin is warm.  Neurological:     Mental Status: She is alert.     Neurovascular status intact muscle strength was found to be adequate range of motion adequate.  Patient is found to have discomfort of a generalized nature both feet localized with moderate depression of the arch no indications of other pathology with good digital perfusion well oriented x3     Assessment:  Appears to be just chronic foot pain secondary to foot structure     Plan:  H&P reviewed condition x-rays and I have recommended over-the-counter insole support shoe gear therapy anti-inflammatories as needed and patient will be seen back to recheck.  Patient is encouraged to call questions concerns which may arise  X-rays were negative for signs of bony pathology stress fracture

## 2023-08-23 ENCOUNTER — Encounter (HOSPITAL_BASED_OUTPATIENT_CLINIC_OR_DEPARTMENT_OTHER): Payer: Self-pay | Admitting: Emergency Medicine

## 2023-08-23 ENCOUNTER — Emergency Department (HOSPITAL_BASED_OUTPATIENT_CLINIC_OR_DEPARTMENT_OTHER): Payer: Medicare Other

## 2023-08-23 ENCOUNTER — Emergency Department (HOSPITAL_BASED_OUTPATIENT_CLINIC_OR_DEPARTMENT_OTHER)
Admission: EM | Admit: 2023-08-23 | Discharge: 2023-08-23 | Disposition: A | Discharge status: Home / Self Care | Payer: Medicare Other | Attending: Emergency Medicine | Admitting: Emergency Medicine

## 2023-08-23 DIAGNOSIS — I82812 Embolism and thrombosis of superficial veins of left lower extremities: Secondary | ICD-10-CM | POA: Diagnosis not present

## 2023-08-23 DIAGNOSIS — I82452 Acute embolism and thrombosis of left peroneal vein: Secondary | ICD-10-CM | POA: Diagnosis not present

## 2023-08-23 DIAGNOSIS — Z7901 Long term (current) use of anticoagulants: Secondary | ICD-10-CM | POA: Insufficient documentation

## 2023-08-23 DIAGNOSIS — M79605 Pain in left leg: Secondary | ICD-10-CM | POA: Diagnosis present

## 2023-08-23 LAB — BASIC METABOLIC PANEL
Anion gap: 7 (ref 5–15)
BUN: 11 mg/dL (ref 6–20)
CO2: 27 mmol/L (ref 22–32)
Calcium: 8.9 mg/dL (ref 8.9–10.3)
Chloride: 102 mmol/L (ref 98–111)
Creatinine, Ser: 0.58 mg/dL (ref 0.44–1.00)
GFR, Estimated: >60 mL/min (ref >60)
Glucose, Bld: 99 mg/dL (ref 70–99)
Potassium: 4 mmol/L (ref 3.5–5.1)
Sodium: 136 mmol/L (ref 135–145)

## 2023-08-23 MED ORDER — RIVAROXABAN 20 MG PO TABS
20.0000 mg | ORAL_TABLET | Freq: Once | ORAL | Status: AC
  Administered 2023-08-23: 20 mg via ORAL
  Filled 2023-08-23: qty 1

## 2023-08-23 MED ORDER — ENOXAPARIN SODIUM 80 MG/0.8ML IJ SOSY: 1.0000 mg/kg | PREFILLED_SYRINGE | Freq: Two times a day (BID) | INTRAMUSCULAR | 0 refills | Status: DC

## 2023-08-23 NOTE — ED Provider Notes (Incomplete)
[facility] Provider Note   CSN: [id number] Arrival date & time: 08/23/23  1206     History {Add pertinent medical, surgical, social history, OB history to HPI:1} Chief Complaint  Patient presents with   Leg Pain    [name] is a 53 y.o. female with a history of left leg DVT and PE, spinal cord injury, presented to the ED with complaint of left leg pain.  Patient reports onset of symptoms within the past day, feeling pain and a "knot" in her left medial upper thigh.  She reports that she had to discontinue her long-term Xarelto about 2 months ago because she could not afford it, although she has rejoined a new payment program and was able to refill her Xarelto beginning yesterday.  She says she has a history of DVT in her left leg about 20 years ago, but also had a saddle PE associated with that.  She denies any current chest pain, difficulty breathing or loss of consciousness.  HPI     Home Medications Prior to Admission medications   Medication Sig Start Date End Date Taking? Authorizing Provider  acetaminophen (TYLENOL) 500 MG tablet Take 500-1,000 mg by mouth every 6 (six) hours as needed (for pain.).    [provider]  baclofen (LIORESAL) 10 MG tablet Take 10 mg by mouth 3 (three) times daily.    [provider]  BELBUCA 150 MCG FILM Place 150 mcg inside cheek at bedtime. 03/27/20   [provider]  BELBUCA 75 MCG FILM Place 75 mcg inside cheek daily. 03/26/20   [provider]  gabapentin (NEURONTIN) 400 MG capsule Take 400 mg by mouth 3 (three) times daily.    [provider]  nitrofurantoin (MACRODANTIN) 100 MG capsule Take 100 mg by mouth daily.    [provider]  oxybutynin (DITROPAN) 5 MG tablet Take 5 mg by mouth in the morning, at noon, and at bedtime. 03/16/20   [provider]  pantoprazole (PROTONIX) 40 MG tablet Take 40 mg by mouth daily. 04/30/20    [provider]  rivaroxaban (XARELTO) 10 MG TABS tablet Take 1 tablet (10 mg total) by mouth at bedtime. 05/17/20   Willis Modena, MD  venlafaxine (EFFEXOR) 75 MG tablet Take 75 mg by mouth 3 (three) times daily with meals.    [provider]      Allergies    Other and Codeine    Review of Systems   Review of Systems  Physical Exam Updated Vital Signs BP 130/60 (BP Location: Right Arm)   Pulse 78   Temp 98.3 F (36.8 C)   Resp 17   Ht 5\' 6"  (1.676 m)   Wt (!) 143.7 kg   SpO2 95%   BMI 51.13 kg/m  Physical Exam Constitutional:      General: She is not in acute distress.    Appearance: She is obese.  HENT:     Head: Normocephalic and atraumatic.  Eyes:     Conjunctiva/sclera: Conjunctivae normal.     Pupils: Pupils are equal, round, and reactive to light.  Cardiovascular:     Rate and Rhythm: Normal rate and regular rhythm.  Pulmonary:     Effort: Pulmonary effort is normal. No respiratory distress.  Musculoskeletal:     Comments: Patient has tenderness along the medial aspect of the left thigh  Skin:    General: Skin is warm and dry.  Neurological:  Mental Status: She is alert. Mental status is at baseline.     Comments: Chronic motor weakness of lower extremity due to spinal cord injury, baseline  Psychiatric:        Mood and Affect: Mood normal.        Behavior: Behavior normal.     ED Results / Procedures / Treatments   Labs (all labs ordered are listed, but only abnormal results are displayed) Labs Reviewed - No data to display  EKG None  Radiology No results found.  Procedures Procedures  {Document cardiac monitor, telemetry assessment procedure when appropriate:1}  Medications Ordered in ED Medications - No data to display  ED Course/ Medical Decision Making/ A&P   {   Click here for ABCD2, HEART and other calculatorsREFRESH Note before signing :1}                              Medical Decision Making  Patient is  here with acute onset atraumatic tenderness along the medial aspect of the left thigh.  Differential would include DVT versus superficial thrombophlebitis at high risk given that she stopped anticoagulation 2 months ago  Her vascular ultrasound was personally reviewed interpreted, notable for ***  No indication for x-ray imaging, low suspicion for underlying fracture.  Doubt infection.  {Document critical care time when appropriate:1} {Document review of labs and clinical decision tools ie heart score, Chads2Vasc2 etc:1}  {Document your independent review of radiology images, and any outside records:1} {Document your discussion with family members, caretakers, and with consultants:1} {Document social determinants of health affecting pt's care:1} {Document your decision making why or why not admission, treatments were needed:1} Final Clinical Impression(s) / ED Diagnoses Final diagnoses:  None    Rx / DC Orders ED Discharge Orders     None

## 2023-08-23 NOTE — ED Triage Notes (Signed)
Pt c/o LT inner thigh pain and is concerned about a DVT; hx of DVT LLE, bil PEs; not taking Xarelto d/t financial concerns; sts she is generally sedentary

## 2023-08-23 NOTE — Discharge Instructions (Addendum)
Instructions:  Beginning tomorrow morning (08/24/23), you will need to take 15 mg of Xarelto twice daily, once in the morning and once at night.  You can use your current prescription for 10 mg tablets at home.  You should take 1-1/2 tablets in the morning and 1 and half tablets at night.  You will do this for the next 21 days (3 weeks).  On day 22, you will switch your dosing to 20 mg once per day.  You will need follow-up with a hematologist for this issue.  I placed a referral to a local group.  If you do not hear from their scheduling office within 2 business days please call to request a follow-up appointment.

## 2023-08-24 ENCOUNTER — Telehealth: Payer: Self-pay | Admitting: *Deleted

## 2023-08-24 NOTE — Telephone Encounter (Signed)
Per Dr.Gudena, scheduled pt for 1/30 at 3pm to be seen as a new hem pt. Pt called and made aware of date and time of appt. Requested to arrive 15 min early. Pt verbalized understanding.

## 2023-08-27 ENCOUNTER — Inpatient Hospital Stay: Payer: Medicare Other

## 2023-08-27 ENCOUNTER — Inpatient Hospital Stay: Payer: Medicare Other | Attending: Hematology and Oncology | Admitting: Hematology and Oncology

## 2023-08-27 ENCOUNTER — Other Ambulatory Visit (HOSPITAL_COMMUNITY): Payer: Self-pay

## 2023-08-27 ENCOUNTER — Encounter: Payer: Self-pay | Admitting: Hematology and Oncology

## 2023-08-27 VITALS — BP 134/51 | HR 77 | Temp 99.2°F | Resp 18 | Ht 66.0 in | Wt 316.2 lb

## 2023-08-27 DIAGNOSIS — Z9089 Acquired absence of other organs: Secondary | ICD-10-CM | POA: Insufficient documentation

## 2023-08-27 DIAGNOSIS — Z86711 Personal history of pulmonary embolism: Secondary | ICD-10-CM | POA: Diagnosis not present

## 2023-08-27 DIAGNOSIS — I82502 Chronic embolism and thrombosis of unspecified deep veins of left lower extremity: Secondary | ICD-10-CM | POA: Diagnosis present

## 2023-08-27 DIAGNOSIS — Z9049 Acquired absence of other specified parts of digestive tract: Secondary | ICD-10-CM | POA: Insufficient documentation

## 2023-08-27 DIAGNOSIS — Z7901 Long term (current) use of anticoagulants: Secondary | ICD-10-CM | POA: Diagnosis not present

## 2023-08-27 DIAGNOSIS — Z885 Allergy status to narcotic agent status: Secondary | ICD-10-CM | POA: Diagnosis not present

## 2023-08-27 DIAGNOSIS — Z79899 Other long term (current) drug therapy: Secondary | ICD-10-CM | POA: Diagnosis not present

## 2023-08-27 DIAGNOSIS — M79605 Pain in left leg: Secondary | ICD-10-CM | POA: Insufficient documentation

## 2023-08-27 DIAGNOSIS — I82402 Acute embolism and thrombosis of unspecified deep veins of left lower extremity: Secondary | ICD-10-CM

## 2023-08-27 MED ORDER — RIVAROXABAN 20 MG PO TABS: 20.0000 mg | ORAL_TABLET | Freq: Every day | ORAL | 11 refills | Status: AC

## 2023-08-27 NOTE — Assessment & Plan Note (Signed)
2005: History of left leg DVT and PE, spinal cord injury Was on lifelong anticoagulation with Xarelto but because she was not able to afford it, she could not continue the medicine.  08/23/2023: Occlusive thrombus greater saphenous vein from proximal thigh down to knee, short segment nonocclusive thrombus posterior tibial vein at the level of mid calf  Recommendation: She is now enrolled in the NP PP program which spreads the cost of the medication over the whole year and therefore she is now able to afford it.  I sent a new prescription for 20 mg of Xarelto.  Recheck ultrasound lower extremities in 3 months and telephone visit after that to discuss results.

## 2023-08-27 NOTE — Progress Notes (Signed)
[facility] CONSULT NOTE  Patient Care Team: Sigmund Hazel, MD as PCP - General (Family Medicine)  CHIEF COMPLAINTS/PURPOSE OF CONSULTATION:  Recurrent DVTs  HISTORY OF PRESENTING ILLNESS:    History of Present Illness   The patient, with a history of blood clots, presents for management of anticoagulation therapy.  She has been managing her anticoagulation therapy with Xarelto for blood clots, initially taking 10 mg daily. Recently, she was in the hospital with painful left leg and recurrent blood clots. She was not able to afford Xarelto last year because of costs. However, she  called her insurance and shes enrolled into NPPP program which makes her cost manageable  She has been experiencing significant pain from a blood clot in her left leg, which she describes as 'so painful'. Despite the pain, there is no significant swelling noted. She also feels unusually tired since increasing the Xarelto dose, although she is unsure if this is related to the medication.  She takes Belbuca 150 mcg and uses furosemide as needed for swelling, approximately twice a year. She also takes nitrofurantoin preventively for urinary infections.  Her past medical history includes a spinal injury from an epidural during pregnancy in 2005, which led to an infection and subsequent use of a walker after four years in a wheelchair. She has a history of blood clots in both lungs and her leg from the same period.         I reviewed her records extensively and collaborated the history with the patient.  SUMMARY OF ONCOLOGIC HISTORY: Oncology History   No history exists.     MEDICAL HISTORY:  Past Medical History:  Diagnosis Date   DVT (deep venous thrombosis) (HCC) 2004   History of spinal cord injury    PE (pulmonary thromboembolism) (HCC) 2004   bilateral   Wears glasses     SURGICAL HISTORY: Past Surgical History:  Procedure Laterality Date   BACK SURGERY     BIOPSY  05/16/2020    Procedure: BIOPSY;  Surgeon: Willis Modena, MD;  Location: [facility];  Service: Endoscopy;;   CHOLECYSTECTOMY     COLONOSCOPY WITH PROPOFOL N/A 05/16/2020   Procedure: COLONOSCOPY WITH PROPOFOL;  Surgeon: Willis Modena, MD;  Location: [facility];  Service: Endoscopy;  Laterality: N/A;   ENDOMETRIAL ABLATION     ESOPHAGOGASTRODUODENOSCOPY (EGD) WITH PROPOFOL N/A 05/16/2020   Procedure: ESOPHAGOGASTRODUODENOSCOPY (EGD) WITH PROPOFOL;  Surgeon: Willis Modena, MD;  Location: [facility];  Service: Endoscopy;  Laterality: N/A;   EYE SURGERY Left    KNEE ARTHROSCOPY Right    KNEE ARTHROSCOPY Left    x3   LUNG SURGERY     TONSILLECTOMY     UPPER ESOPHAGEAL ENDOSCOPIC ULTRASOUND (EUS) N/A 05/16/2020   Procedure: UPPER ESOPHAGEAL ENDOSCOPIC ULTRASOUND (EUS);  Surgeon: Willis Modena, MD;  Location: [facility];  Service: Endoscopy;  Laterality: N/A;   WISDOM TOOTH EXTRACTION      SOCIAL HISTORY: Social History   Socioeconomic History   Marital status: Single    Spouse name: Not on file   Number of children: Not on file   Years of education: Not on file   Highest education level: Not on file  Occupational History   Not on file  Tobacco Use   Smoking status: Never   Smokeless tobacco: Never  Vaping Use   Vaping status: Never Used  Substance and Sexual Activity   Alcohol use: Never   Drug use: Never   Sexual activity: Not on file  Other  Topics Concern   Not on file  Social History Narrative   Not on file   Social Drivers of Health   Financial Resource Strain: Not on file  Food Insecurity: Low Risk  (07/23/2023)   Received from [facility]   Hunger Vital Sign    Worried About Running Out of Food in the Last Year: Never true    Ran Out of Food in the Last Year: Never true  Transportation Needs: No Transportation Needs (07/23/2023)   Received from [facility]    In the past 12 months, has lack of reliable transportation kept you from medical  appointments, meetings, work or from getting things needed for daily living? : No  Physical Activity: Not on file  Stress: Not on file  Social Connections: Not on file  Intimate Partner Violence: Not on file    FAMILY HISTORY: No family history on file.  ALLERGIES:  is allergic to other and codeine.  MEDICATIONS:  Current Outpatient Medications  Medication Sig Dispense Refill   rivaroxaban (XARELTO) 20 MG TABS tablet Take 1 tablet (20 mg total) by mouth daily with supper. 30 tablet 11   baclofen (LIORESAL) 10 MG tablet Take 10 mg by mouth 3 (three) times daily.     BELBUCA 150 MCG FILM Place 150 mcg inside cheek at bedtime.     gabapentin (NEURONTIN) 400 MG capsule Take 400 mg by mouth 3 (three) times daily.     nitrofurantoin (MACRODANTIN) 100 MG capsule Take 100 mg by mouth daily.     oxybutynin (DITROPAN) 5 MG tablet Take 5 mg by mouth in the morning, at noon, and at bedtime.     venlafaxine (EFFEXOR) 75 MG tablet Take 75 mg by mouth 3 (three) times daily with meals.     No current facility-administered medications for this visit.    REVIEW OF SYSTEMS:   Constitutional: Denies fevers, chills or abnormal night sweats Because of her spinal injury, she cannot feel both her legs but she has pain related to the blood clot. All other systems were reviewed with the patient and are negative.  PHYSICAL EXAMINATION: ECOG PERFORMANCE STATUS: 2 - Symptomatic, <50% confined to bed  Vitals:   08/27/23 1508  BP: (!) 134/51  Pulse: 77  Resp: 18  Temp: 99.2 F (37.3 C)  SpO2: 98%   Filed Weights   08/27/23 1508  Weight: (!) 316 lb 3.2 oz (143.4 kg)    GENERAL:alert, no distress and comfortable    LABORATORY DATA:  I have reviewed the data as listed Lab Results  Component Value Date   WBC 8.3 03/20/2008   HGB 15.2 (H) 03/20/2008   HCT 45.5 03/20/2008   MCV 81.7 03/20/2008   PLT 272 03/20/2008   Lab Results  Component Value Date   NA 136 08/23/2023   K 4.0 08/23/2023    CL 102 08/23/2023   CO2 27 08/23/2023    RADIOGRAPHIC STUDIES: I have personally reviewed the radiological reports and agreed with the findings in the report.  ASSESSMENT AND PLAN:  Acute thromboembolism of deep veins of lower extremity (HCC) 2005: History of left leg DVT and PE, spinal cord injury Was on lifelong anticoagulation with Xarelto but because she was not able to afford it, she could not continue the medicine.  08/23/2023: Occlusive thrombus greater saphenous vein from proximal thigh down to knee, short segment nonocclusive thrombus posterior tibial vein at the level of mid calf  Recommendation: She is now enrolled in the  NP PP program which spreads the cost of the medication over the whole year and therefore she is now able to afford it.  I sent a new prescription for 20 mg of Xarelto.  Recheck ultrasound lower extremities in 3 months and telephone visit after that to discuss results.  Assessment and Plan    Deep Vein Thrombosis (DVT) Chronic DVT with current episode in the left leg. Severe pain and redness reported. History of DVT in lungs and legs in 2005. Currently on Xarelto 10 mg, needs adjustment to 20 mg daily. Advised on compression socks to decrease risk. Emphasized adherence to anticoagulation therapy and follow-up ultrasound in three months to assess clot resolution. Explained that clots take approximately three months to dissolve and Xarelto is needed indefinitely. Cost of Xarelto will be reduced to $45 per month after meeting the deductible due to a new Medicare program. 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 Uses a walker for mobility. - Continue current management and mobility  aids  General Health Maintenance Educated about a new Medicare program to manage medication costs, aiding in Xarelto affordability. - Educate on Medicare program for medication cost management  Follow-up - Schedule follow-up ultrasound of lower extremities in three months - Call with ultrasound results - Renew Xarelto prescription monthly for one year.         All questions were answered. The patient knows to call the clinic with any problems, questions or concerns.    Tamsen Meek, MD 08/27/23

## 2023-09-01 ENCOUNTER — Other Ambulatory Visit: Payer: Self-pay | Admitting: *Deleted

## 2023-09-01 DIAGNOSIS — I82402 Acute embolism and thrombosis of unspecified deep veins of left lower extremity: Secondary | ICD-10-CM

## 2023-09-03 ENCOUNTER — Encounter: Payer: Self-pay | Admitting: Hematology and Oncology

## 2023-09-30 ENCOUNTER — Other Ambulatory Visit: Payer: Self-pay | Admitting: Medical Genetics

## 2023-11-25 ENCOUNTER — Ambulatory Visit (HOSPITAL_COMMUNITY)
Admission: RE | Admit: 2023-11-25 | Discharge: 2023-11-25 | Disposition: A | Discharge status: Home / Self Care | Payer: Medicare Other | Source: Ambulatory Visit | Attending: Hematology and Oncology | Admitting: Hematology and Oncology

## 2023-11-25 DIAGNOSIS — I82402 Acute embolism and thrombosis of unspecified deep veins of left lower extremity: Secondary | ICD-10-CM | POA: Diagnosis present

## 2023-11-25 NOTE — Progress Notes (Signed)
 Lower extremity venous duplex completed. Please see CV Procedures for preliminary results.  Estanislao Heimlich, RVT 11/25/23 10:40 AM

## 2023-12-02 ENCOUNTER — Inpatient Hospital Stay: Payer: Medicare Other | Attending: Hematology and Oncology | Admitting: Hematology and Oncology

## 2023-12-02 DIAGNOSIS — I82409 Acute embolism and thrombosis of unspecified deep veins of unspecified lower extremity: Secondary | ICD-10-CM

## 2023-12-02 NOTE — Assessment & Plan Note (Signed)
 2005: History of left leg DVT and PE, spinal cord injury Was on lifelong anticoagulation with Xarelto  but because she was not able to afford it, she could not continue the medicine.   08/23/2023: Occlusive thrombus greater saphenous vein from proximal thigh down to knee, short segment nonocclusive thrombus posterior tibial vein at the level of mid calf   Recommendation: She is now enrolled in the NP PP program which spreads the cost of the medication over the whole year and therefore she is now able to afford it.  I sent a new prescription for 20 mg of Xarelto .   11/25/2023: Ultrasound lower extremity: No evidence of DVT We discussed the pros and cons of continuing long-term anticoagulation versus stopping it.  The main concern is of cause recurrent blood clots.

## 2023-12-02 NOTE — Progress Notes (Signed)
 HEMATOLOGY-ONCOLOGY TELEPHONE VISIT PROGRESS NOTE  I connected with our patient on 12/02/23 at 11:15 AM EDT by telephone and verified that I am speaking with the correct person using two identifiers.  I discussed the limitations, risks, security and privacy concerns of performing an evaluation and management service by telephone and the availability of in person appointments.  I also discussed with the patient that there may be a patient responsible charge related to this service. The patient expressed understanding and agreed to proceed.   History of Present Illness: Follow-up after recent ultrasound of the lower extremities  History of Present Illness [name] is a 53 year old female with a history of blood clots who presents for follow-up regarding her anticoagulation therapy.  She is currently on anticoagulation therapy, with recent ultrasound showing resolution of blood clots. She experienced a previous clot in 2005 and another when unable to afford medication, which resolved after resuming treatment. She is now able to obtain her medication through her current program. No pain, swelling, or discomfort in her legs.   REVIEW OF SYSTEMS:   Constitutional: Denies fevers, chills or abnormal weight loss All other systems were reviewed with the patient and are negative. Observations/Objective:     Assessment Plan:  Acute thromboembolism of deep veins of lower extremity (HCC) 2005: History of left leg DVT and PE, spinal cord injury Was on lifelong anticoagulation with Xarelto  but because she was not able to afford it, she could not continue the medicine.   08/23/2023: Occlusive thrombus greater saphenous vein from proximal thigh down to knee, short segment nonocclusive thrombus posterior tibial vein at the level of mid calf   Recommendation: She is now enrolled in the NP PP program which spreads the cost of the medication over the whole year and therefore she is now able to afford  it.  I sent a new prescription for 20 mg of Xarelto .   11/25/2023: Ultrasound lower extremity: No evidence of DVT We discussed the pros and cons of continuing long-term anticoagulation versus stopping it.  The main concern is of cause recurrent blood clots.   Patient is able to afford her medication at this time and she is interested in continuing blood thinners indefinitely. Telephone visit in 1 year      I discussed the assessment and treatment plan with the patient. The patient was provided an opportunity to ask questions and all were answered. The patient agreed with the plan and demonstrated an understanding of the instructions. The patient was advised to call back or seek an in-person evaluation if the symptoms worsen or if the condition fails to improve as anticipated.   I provided 20 minutes of non-face-to-face time during this encounter.  This includes time for charting and coordination of care   Margert Sheerer, MD

## 2024-05-30 ENCOUNTER — Other Ambulatory Visit: Payer: Self-pay | Admitting: Medical Genetics

## 2024-05-30 DIAGNOSIS — Z006 Encounter for examination for normal comparison and control in clinical research program: Secondary | ICD-10-CM
